# Patient Record
Sex: Male | Born: 1960
Health system: Southern US, Community
[De-identification: ages and names within clinical notes are randomized; demographics above are authoritative.]

## PROBLEM LIST (undated history)

## (undated) DIAGNOSIS — I4891 Unspecified atrial fibrillation: Secondary | ICD-10-CM

## (undated) DIAGNOSIS — IMO0002 Reserved for concepts with insufficient information to code with codable children: Secondary | ICD-10-CM

## (undated) DIAGNOSIS — K219 Gastro-esophageal reflux disease without esophagitis: Secondary | ICD-10-CM

## (undated) DIAGNOSIS — E669 Obesity, unspecified: Secondary | ICD-10-CM

## (undated) DIAGNOSIS — I219 Acute myocardial infarction, unspecified: Secondary | ICD-10-CM

## (undated) DIAGNOSIS — F4481 Dissociative identity disorder: Secondary | ICD-10-CM

## (undated) DIAGNOSIS — J449 Chronic obstructive pulmonary disease, unspecified: Secondary | ICD-10-CM

## (undated) DIAGNOSIS — Z950 Presence of cardiac pacemaker: Secondary | ICD-10-CM

## (undated) DIAGNOSIS — E781 Pure hyperglyceridemia: Secondary | ICD-10-CM

## (undated) DIAGNOSIS — R55 Syncope and collapse: Secondary | ICD-10-CM

## (undated) DIAGNOSIS — F319 Bipolar disorder, unspecified: Secondary | ICD-10-CM

## (undated) DIAGNOSIS — E119 Type 2 diabetes mellitus without complications: Secondary | ICD-10-CM

## (undated) DIAGNOSIS — I2510 Atherosclerotic heart disease of native coronary artery without angina pectoris: Secondary | ICD-10-CM

## (undated) HISTORY — DX: Presence of cardiac pacemaker: Z95.0

## (undated) HISTORY — DX: Type 2 diabetes mellitus without complications: E11.9

## (undated) HISTORY — DX: Pure hyperglyceridemia: E78.1

## (undated) HISTORY — DX: Syncope and collapse: R55

## (undated) HISTORY — DX: Obesity, unspecified: E66.9

---

## 2001-05-02 ENCOUNTER — Encounter: Payer: Self-pay | Admitting: *Deleted

## 2001-05-02 ENCOUNTER — Emergency Department (HOSPITAL_COMMUNITY): Admission: EM | Admit: 2001-05-02 | Discharge: 2001-05-02 | Payer: Self-pay | Admitting: *Deleted

## 2001-05-08 ENCOUNTER — Encounter: Payer: Self-pay | Admitting: *Deleted

## 2001-05-08 ENCOUNTER — Emergency Department (HOSPITAL_COMMUNITY): Admission: EM | Admit: 2001-05-08 | Discharge: 2001-05-08 | Payer: Self-pay | Admitting: Emergency Medicine

## 2001-05-28 ENCOUNTER — Encounter: Admission: RE | Admit: 2001-05-28 | Discharge: 2001-07-03 | Payer: Self-pay | Admitting: Family Medicine

## 2002-03-11 ENCOUNTER — Emergency Department (HOSPITAL_COMMUNITY): Admission: EM | Admit: 2002-03-11 | Discharge: 2002-03-11 | Payer: Self-pay | Admitting: Emergency Medicine

## 2002-09-22 ENCOUNTER — Encounter: Payer: Self-pay | Admitting: Emergency Medicine

## 2002-09-22 ENCOUNTER — Inpatient Hospital Stay (HOSPITAL_COMMUNITY): Admission: EM | Admit: 2002-09-22 | Discharge: 2002-09-28 | Payer: Self-pay | Admitting: Emergency Medicine

## 2002-09-23 ENCOUNTER — Encounter: Payer: Self-pay | Admitting: *Deleted

## 2002-09-23 ENCOUNTER — Encounter: Payer: Self-pay | Admitting: Internal Medicine

## 2002-09-24 ENCOUNTER — Encounter: Payer: Self-pay | Admitting: Internal Medicine

## 2002-09-26 ENCOUNTER — Encounter: Payer: Self-pay | Admitting: *Deleted

## 2002-09-26 HISTORY — PX: CARDIAC CATHETERIZATION: SHX172

## 2002-12-08 ENCOUNTER — Inpatient Hospital Stay (HOSPITAL_COMMUNITY): Admission: EM | Admit: 2002-12-08 | Discharge: 2002-12-10 | Payer: Self-pay | Admitting: Emergency Medicine

## 2002-12-08 ENCOUNTER — Encounter: Payer: Self-pay | Admitting: Emergency Medicine

## 2002-12-10 ENCOUNTER — Encounter: Payer: Self-pay | Admitting: *Deleted

## 2002-12-11 ENCOUNTER — Emergency Department (HOSPITAL_COMMUNITY): Admission: EM | Admit: 2002-12-11 | Discharge: 2002-12-11 | Payer: Self-pay | Admitting: Emergency Medicine

## 2002-12-11 ENCOUNTER — Inpatient Hospital Stay (HOSPITAL_COMMUNITY): Admission: EM | Admit: 2002-12-11 | Discharge: 2002-12-12 | Payer: Self-pay | Admitting: Cardiology

## 2003-01-15 ENCOUNTER — Inpatient Hospital Stay (HOSPITAL_COMMUNITY): Admission: EM | Admit: 2003-01-15 | Discharge: 2003-01-17 | Payer: Self-pay | Admitting: Emergency Medicine

## 2003-01-15 ENCOUNTER — Encounter: Payer: Self-pay | Admitting: Emergency Medicine

## 2003-01-16 ENCOUNTER — Encounter: Payer: Self-pay | Admitting: Emergency Medicine

## 2003-01-24 ENCOUNTER — Encounter: Payer: Self-pay | Admitting: Emergency Medicine

## 2003-01-24 ENCOUNTER — Observation Stay (HOSPITAL_COMMUNITY): Admission: EM | Admit: 2003-01-24 | Discharge: 2003-01-25 | Payer: Self-pay | Admitting: Emergency Medicine

## 2003-01-28 ENCOUNTER — Inpatient Hospital Stay (HOSPITAL_COMMUNITY): Admission: EM | Admit: 2003-01-28 | Discharge: 2003-02-04 | Payer: Self-pay | Admitting: Cardiology

## 2003-02-02 ENCOUNTER — Encounter: Payer: Self-pay | Admitting: *Deleted

## 2003-02-03 HISTORY — PX: PACEMAKER INSERTION: SHX728

## 2003-02-04 ENCOUNTER — Encounter: Payer: Self-pay | Admitting: *Deleted

## 2003-02-26 ENCOUNTER — Ambulatory Visit (HOSPITAL_COMMUNITY): Admission: RE | Admit: 2003-02-26 | Discharge: 2003-02-26 | Payer: Self-pay | Admitting: *Deleted

## 2003-02-26 ENCOUNTER — Encounter: Payer: Self-pay | Admitting: *Deleted

## 2003-04-03 ENCOUNTER — Encounter: Payer: Self-pay | Admitting: *Deleted

## 2003-04-04 ENCOUNTER — Inpatient Hospital Stay (HOSPITAL_COMMUNITY): Admission: EM | Admit: 2003-04-04 | Discharge: 2003-04-07 | Payer: Self-pay | Admitting: *Deleted

## 2003-04-07 ENCOUNTER — Encounter: Payer: Self-pay | Admitting: Neurology

## 2003-04-16 ENCOUNTER — Emergency Department (HOSPITAL_COMMUNITY): Admission: EM | Admit: 2003-04-16 | Discharge: 2003-04-16 | Payer: Self-pay | Admitting: Emergency Medicine

## 2003-04-22 ENCOUNTER — Emergency Department (HOSPITAL_COMMUNITY): Admission: EM | Admit: 2003-04-22 | Discharge: 2003-04-22 | Payer: Self-pay | Admitting: Emergency Medicine

## 2003-05-01 ENCOUNTER — Encounter: Payer: Self-pay | Admitting: *Deleted

## 2003-05-01 ENCOUNTER — Emergency Department (HOSPITAL_COMMUNITY): Admission: EM | Admit: 2003-05-01 | Discharge: 2003-05-01 | Payer: Self-pay | Admitting: *Deleted

## 2003-06-09 ENCOUNTER — Emergency Department (HOSPITAL_COMMUNITY): Admission: EM | Admit: 2003-06-09 | Discharge: 2003-06-10 | Payer: Self-pay | Admitting: Emergency Medicine

## 2003-10-08 ENCOUNTER — Inpatient Hospital Stay (HOSPITAL_COMMUNITY): Admission: EM | Admit: 2003-10-08 | Discharge: 2003-10-09 | Payer: Self-pay | Admitting: Emergency Medicine

## 2003-10-16 ENCOUNTER — Emergency Department (HOSPITAL_COMMUNITY): Admission: EM | Admit: 2003-10-16 | Discharge: 2003-10-16 | Payer: Self-pay | Admitting: Emergency Medicine

## 2003-11-02 ENCOUNTER — Inpatient Hospital Stay (HOSPITAL_COMMUNITY): Admission: AD | Admit: 2003-11-02 | Discharge: 2003-11-04 | Payer: Self-pay | Admitting: Cardiovascular Disease

## 2003-11-02 ENCOUNTER — Encounter: Payer: Self-pay | Admitting: Emergency Medicine

## 2003-11-18 ENCOUNTER — Emergency Department (HOSPITAL_COMMUNITY): Admission: EM | Admit: 2003-11-18 | Discharge: 2003-11-19 | Payer: Self-pay | Admitting: Emergency Medicine

## 2003-12-31 ENCOUNTER — Emergency Department (HOSPITAL_COMMUNITY): Admission: EM | Admit: 2003-12-31 | Discharge: 2003-12-31 | Payer: Self-pay | Admitting: Emergency Medicine

## 2006-09-26 ENCOUNTER — Ambulatory Visit (HOSPITAL_COMMUNITY): Admission: RE | Admit: 2006-09-26 | Discharge: 2006-09-26 | Payer: Self-pay | Admitting: *Deleted

## 2008-12-04 ENCOUNTER — Inpatient Hospital Stay (HOSPITAL_COMMUNITY): Admission: EM | Admit: 2008-12-04 | Discharge: 2008-12-06 | Payer: Self-pay | Admitting: Emergency Medicine

## 2008-12-05 ENCOUNTER — Encounter (INDEPENDENT_AMBULATORY_CARE_PROVIDER_SITE_OTHER): Payer: Self-pay | Admitting: Family Medicine

## 2008-12-22 ENCOUNTER — Ambulatory Visit: Payer: Self-pay | Admitting: Cardiology

## 2010-03-10 ENCOUNTER — Emergency Department (HOSPITAL_COMMUNITY): Admission: EM | Admit: 2010-03-10 | Discharge: 2010-03-10 | Payer: Self-pay | Admitting: Emergency Medicine

## 2010-08-09 ENCOUNTER — Encounter: Payer: Self-pay | Admitting: Cardiovascular Disease

## 2010-10-26 LAB — CBC
HCT: 36.9 % — ABNORMAL LOW (ref 39.0–52.0)
HCT: 38 % — ABNORMAL LOW (ref 39.0–52.0)
HCT: 42.7 % (ref 39.0–52.0)
Hemoglobin: 13.4 g/dL (ref 13.0–17.0)
Hemoglobin: 13.8 g/dL (ref 13.0–17.0)
Hemoglobin: 15.4 g/dL (ref 13.0–17.0)
MCHC: 36.1 g/dL — ABNORMAL HIGH (ref 30.0–36.0)
MCHC: 36.2 g/dL — ABNORMAL HIGH (ref 30.0–36.0)
MCHC: 36.3 g/dL — ABNORMAL HIGH (ref 30.0–36.0)
MCV: 90.3 fL (ref 78.0–100.0)
MCV: 90.5 fL (ref 78.0–100.0)
MCV: 90.6 fL (ref 78.0–100.0)
Platelets: 235 10*3/uL (ref 150–400)
Platelets: 242 10*3/uL (ref 150–400)
Platelets: 244 10*3/uL (ref 150–400)
RBC: 4.08 MIL/uL — ABNORMAL LOW (ref 4.22–5.81)
RBC: 4.21 MIL/uL — ABNORMAL LOW (ref 4.22–5.81)
RBC: 4.71 MIL/uL (ref 4.22–5.81)
RDW: 12.7 % (ref 11.5–15.5)
RDW: 12.9 % (ref 11.5–15.5)
RDW: 13 % (ref 11.5–15.5)
WBC: 10 10*3/uL (ref 4.0–10.5)
WBC: 10.3 10*3/uL (ref 4.0–10.5)
WBC: 11.7 10*3/uL — ABNORMAL HIGH (ref 4.0–10.5)

## 2010-10-26 LAB — DIFFERENTIAL
Basophils Absolute: 0.1 10*3/uL (ref 0.0–0.1)
Basophils Absolute: 0.1 10*3/uL (ref 0.0–0.1)
Basophils Absolute: 0.1 10*3/uL (ref 0.0–0.1)
Basophils Relative: 1 % (ref 0–1)
Basophils Relative: 1 % (ref 0–1)
Basophils Relative: 1 % (ref 0–1)
Eosinophils Absolute: 0.3 10*3/uL (ref 0.0–0.7)
Eosinophils Absolute: 0.3 10*3/uL (ref 0.0–0.7)
Eosinophils Absolute: 0.4 10*3/uL (ref 0.0–0.7)
Eosinophils Relative: 3 % (ref 0–5)
Eosinophils Relative: 3 % (ref 0–5)
Eosinophils Relative: 4 % (ref 0–5)
Lymphocytes Relative: 23 % (ref 12–46)
Lymphocytes Relative: 23 % (ref 12–46)
Lymphocytes Relative: 28 % (ref 12–46)
Lymphs Abs: 2.3 10*3/uL (ref 0.7–4.0)
Lymphs Abs: 2.7 10*3/uL (ref 0.7–4.0)
Lymphs Abs: 2.8 10*3/uL (ref 0.7–4.0)
Monocytes Absolute: 0.6 10*3/uL (ref 0.1–1.0)
Monocytes Absolute: 0.6 10*3/uL (ref 0.1–1.0)
Monocytes Absolute: 0.7 10*3/uL (ref 0.1–1.0)
Monocytes Relative: 6 % (ref 3–12)
Monocytes Relative: 6 % (ref 3–12)
Monocytes Relative: 6 % (ref 3–12)
Neutro Abs: 6 10*3/uL (ref 1.7–7.7)
Neutro Abs: 6.9 10*3/uL (ref 1.7–7.7)
Neutro Abs: 7.9 10*3/uL — ABNORMAL HIGH (ref 1.7–7.7)
Neutrophils Relative %: 61 % (ref 43–77)
Neutrophils Relative %: 67 % (ref 43–77)
Neutrophils Relative %: 67 % (ref 43–77)

## 2010-10-26 LAB — GLUCOSE, CAPILLARY
Glucose-Capillary: 104 mg/dL — ABNORMAL HIGH (ref 70–99)
Glucose-Capillary: 121 mg/dL — ABNORMAL HIGH (ref 70–99)
Glucose-Capillary: 140 mg/dL — ABNORMAL HIGH (ref 70–99)
Glucose-Capillary: 145 mg/dL — ABNORMAL HIGH (ref 70–99)
Glucose-Capillary: 186 mg/dL — ABNORMAL HIGH (ref 70–99)
Glucose-Capillary: 205 mg/dL — ABNORMAL HIGH (ref 70–99)
Glucose-Capillary: 207 mg/dL — ABNORMAL HIGH (ref 70–99)
Glucose-Capillary: 207 mg/dL — ABNORMAL HIGH (ref 70–99)
Glucose-Capillary: 231 mg/dL — ABNORMAL HIGH (ref 70–99)
Glucose-Capillary: 248 mg/dL — ABNORMAL HIGH (ref 70–99)
Glucose-Capillary: 279 mg/dL — ABNORMAL HIGH (ref 70–99)
Glucose-Capillary: 283 mg/dL — ABNORMAL HIGH (ref 70–99)
Glucose-Capillary: 299 mg/dL — ABNORMAL HIGH (ref 70–99)
Glucose-Capillary: 315 mg/dL — ABNORMAL HIGH (ref 70–99)

## 2010-10-26 LAB — COMPREHENSIVE METABOLIC PANEL
ALT: 35 U/L (ref 0–53)
AST: 24 U/L (ref 0–37)
Albumin: 3.3 g/dL — ABNORMAL LOW (ref 3.5–5.2)
Alkaline Phosphatase: 113 U/L (ref 39–117)
BUN: 6 mg/dL (ref 6–23)
CO2: 26 mEq/L (ref 19–32)
Calcium: 8.5 mg/dL (ref 8.4–10.5)
Chloride: 95 mEq/L — ABNORMAL LOW (ref 96–112)
Creatinine, Ser: 0.91 mg/dL (ref 0.4–1.5)
GFR calc Af Amer: >60 mL/min (ref >60)
GFR calc non Af Amer: >60 mL/min (ref >60)
Glucose, Bld: 413 mg/dL — ABNORMAL HIGH (ref 70–99)
Potassium: 3.3 mEq/L — ABNORMAL LOW (ref 3.5–5.1)
Sodium: 131 mEq/L — ABNORMAL LOW (ref 135–145)
Total Bilirubin: 0.3 mg/dL (ref 0.3–1.2)
Total Protein: 6.2 g/dL (ref 6.0–8.3)

## 2010-10-26 LAB — PROTIME-INR
INR: 0.9 (ref 0.00–1.49)
INR: 1 (ref 0.00–1.49)
Prothrombin Time: 12.5 seconds (ref 11.6–15.2)
Prothrombin Time: 13.1 seconds (ref 11.6–15.2)

## 2010-10-26 LAB — BASIC METABOLIC PANEL
BUN: 4 mg/dL — ABNORMAL LOW (ref 6–23)
BUN: 5 mg/dL — ABNORMAL LOW (ref 6–23)
CO2: 26 mEq/L (ref 19–32)
CO2: 28 mEq/L (ref 19–32)
Calcium: 7.9 mg/dL — ABNORMAL LOW (ref 8.4–10.5)
Calcium: 8.1 mg/dL — ABNORMAL LOW (ref 8.4–10.5)
Chloride: 105 mEq/L (ref 96–112)
Chloride: 105 mEq/L (ref 96–112)
Creatinine, Ser: 0.69 mg/dL (ref 0.4–1.5)
Creatinine, Ser: 0.78 mg/dL (ref 0.4–1.5)
GFR calc Af Amer: >60 mL/min (ref >60)
GFR calc Af Amer: >60 mL/min (ref >60)
GFR calc non Af Amer: >60 mL/min (ref >60)
GFR calc non Af Amer: >60 mL/min (ref >60)
Glucose, Bld: 235 mg/dL — ABNORMAL HIGH (ref 70–99)
Glucose, Bld: 247 mg/dL — ABNORMAL HIGH (ref 70–99)
Potassium: 3.4 mEq/L — ABNORMAL LOW (ref 3.5–5.1)
Potassium: 3.6 mEq/L (ref 3.5–5.1)
Sodium: 137 mEq/L (ref 135–145)
Sodium: 138 mEq/L (ref 135–145)

## 2010-10-26 LAB — CARDIAC PANEL(CRET KIN+CKTOT+MB+TROPI)
CK, MB: 1 ng/mL (ref 0.3–4.0)
Relative Index: INVALID (ref 0.0–2.5)
Total CK: 76 U/L (ref 7–232)
Troponin I: 0.01 ng/mL (ref 0.00–0.06)

## 2010-10-26 LAB — POCT CARDIAC MARKERS
CKMB, poc: <1 ng/mL — ABNORMAL LOW (ref 1.0–8.0)
CKMB, poc: <1 ng/mL — ABNORMAL LOW (ref 1.0–8.0)
Myoglobin, poc: 36.4 ng/mL (ref 12–200)
Myoglobin, poc: 39.3 ng/mL (ref 12–200)
Troponin i, poc: <0.05 ng/mL (ref 0.00–0.09)
Troponin i, poc: <0.05 ng/mL (ref 0.00–0.09)

## 2010-10-26 LAB — APTT: aPTT: 27 seconds (ref 24–37)

## 2010-10-26 LAB — LITHIUM LEVEL: Lithium Lvl: <0.25 mEq/L — ABNORMAL LOW (ref 0.80–1.40)

## 2010-10-26 LAB — MAGNESIUM: Magnesium: 2 mg/dL (ref 1.5–2.5)

## 2010-10-26 LAB — HEMOGLOBIN A1C
Hgb A1c MFr Bld: 12.2 % — ABNORMAL HIGH (ref 4.6–6.1)
Mean Plasma Glucose: 303 mg/dL

## 2010-10-26 LAB — PHOSPHORUS: Phosphorus: 3 mg/dL (ref 2.3–4.6)

## 2010-11-30 NOTE — H&P (Signed)
Frank Powell, Frank Powell NO.:  [id number]   MEDICAL RECORD NO.:  [id number]          PATIENT TYPE:  INP   LOCATION:  A315                          FACILITY:  [facility]   PHYSICIAN:  Dorris Singh, DO    DATE OF BIRTH:  1961/01/01   DATE OF ADMISSION:  12/04/2008  DATE OF DISCHARGE:  LH                              HISTORY & PHYSICAL   CHIEF COMPLAINT:  Chest discomfort.   The patient is a 50 year old male who presented to the [facility]  emergency room with a chief complaint of chest discomfort that started  yesterday and it was insidious and the pattern has been constant.  The  course has been stable and it is located on the left side with no  radiation.  He describes it being a pressure feeling and it is 9/10 at  its worse.  When he was seen in the emergency room it was 9/10 but now  it has actually been relieved.  It was aggravated by nothing and nothing  made it feel better.  It had been associated with some cough, dyspnea  and wheezing, and he does have a positive history for smoking.  He does  have a history of a pacemaker.   PAST MEDICAL HISTORY:  He does have a history of coronary artery disease  and hypertension.   PAST SURGICAL HISTORY:  He does have surgical history for pacemaker  insertion.   SOCIAL HISTORY:  Nondrinker.  No drug abuse but does smoke.   CARDIOLOGIST:  [facility].   ALLERGIES:  He has no known drug allergies.   MEDICATIONS:  He is only able to give [place] one of his medications which is  Geodon.   REVIEW OF SYSTEMS:  As mentioned above, as reviewed in the HPI.   TESTS:  He currently had a chest x-ray which showed no acute  abnormalities with a two-view chest.   LABORATORY DATA:  White count 10.3, hemoglobin 15.4, hematocrit 42.7 and  platelet count of 242.  Sodium 131, potassium 3.3, chloride 95, carbon  dioxide 26, glucose 413, BUN 6, creatinine 0.91.  His INR is 0.9.  First  set of troponins are negative.   ASSESSMENT/PLAN:  1. Chest pain.  2. Hyponatremia.  3. Hypokalemia.   Will go ahead and admit the patient to observation for chest pain for  rule out.  Will have [facility] see him on him.  Will give  him IV fluids to help replace his potassium and sodium.  Will continue  to monitor him with blood work in the morning and an EKG.  Will do GI  prophylaxis and DVT prophylaxis and will place him on his home  medications once he gives [place] his doses.  We will continue to monitor and  make any necessary changes.      Dorris Singh, DO  Electronically Signed     CB/MEDQ  D:  12/04/2008  T:  12/04/2008  Job:  [id number]

## 2010-11-30 NOTE — Group Therapy Note (Signed)
Frank Powell, Frank Powell NO.:  [id number]   MEDICAL RECORD NO.:  [id number]          PATIENT TYPE:  INP   LOCATION:  A315                          FACILITY:  [facility]   PHYSICIAN:  Dorris Singh, DO    DATE OF BIRTH:  Jun 10, 1961   DATE OF PROCEDURE:  12/05/2008  DATE OF DISCHARGE:                                 PROGRESS NOTE   The patient was seen today, having no complaints.  Chest pain has still  been present.  Cardiology is supposed to see him today.  Also his blood  sugars were brought down and he was taken off the Glucommander.   VITAL SIGNS:  Temperature 98.1, pulse 64, respirations 20, blood  pressure  98/63.  GENERALLY:  The patient is well-developed, well-nourished, in no acute  distress, has flat affect  HEART:  Regular rate and rhythm.  LUNGS:  Clear to auscultation bilaterally.  ABDOMEN:  Soft, nontender, nondistended.  EXTREMITIES:  Positive pulses.  No edema, ecchymosis or cyanosis.   White count 11.7, hemoglobin 13.8, hematocrit 38.0 and platelet count  235.  His BMET is within normal limits except for glucose of 235 and a  BUN of 5.  His lithium level is also low.   ASSESSMENT/PLAN:  1. Chest pain, awaiting cardiology recommendations.  I suspect that      the patient may need further workup, but this may be able to be      done with outpatient cardiology, so await their further      recommendations.  2. New-onset diabetes.  Will continue to monitor the patient and start      him on Lantus and have him follow up with his primary care doctor      once he is discharged, as well.  Lantus as well as metformin and      hopefully we will get him outpatient diabetic teaching.   If he continues to improve, he can be discharged within the next 24-48  hours.      Dorris Singh, DO  Electronically Signed     CB/MEDQ  D:  12/05/2008  T:  12/05/2008  Job:  [id number]

## 2010-11-30 NOTE — Consult Note (Signed)
Frank Powell, Frank Powell NO.:  [id number]   MEDICAL RECORD NO.:  [id number]          PATIENT TYPE:  INP   LOCATION:  A315                          FACILITY:  [facility]   PHYSICIAN:  Antonieta Iba, MD   DATE OF BIRTH:  08-Aug-1960   DATE OF CONSULTATION:  12/04/2008  DATE OF DISCHARGE:                                 CONSULTATION   Frank Powell is a 50 year old gentleman with chronic bipolar disorder,  continued tobacco use, who smokes one pack per day, obstructive sleep  apnea on CPAP with normal coronary arteries by catheterization in March  2004 with history of syncope with pacemaker placed for neurocardiogenic  syncope and July 2004 who presented with chest pain.   Frank Powell states the chest pain started two days  ago and was stuttering  on Wednesday and then got worse on Thursday.  He states that he was  sitting on the couch watching TV when his chest pain first presented.  Typically the pain came on at rest and was not associated with exertion.  He denied any radiation to his shoulders, jaw or neck.  He had no  sweating or diaphoresis.  No GI upset.  He has not had chest pain like  this before.  He in general leads a  very sedentary lifestyle and stays  at his sister's house.  Otherwise he has no complaints and states his  appetite is good and he has  been ambulating without any significant  difficulty.   PAST MEDICAL HISTORY:  Notable for the clean coronaries in March 2004,  history of syncope with occasional lightheaded spells.  Diagnosis of  recurrent  vasopressor syncope/ neurocardiogenic syncope with pacemaker  placed in July 2004, obstructive sleep apnea on CPAP,  long history of  smoking, and bipolar disorder.   MEDICATIONS:  Include Neurontin  300 mg t.i.d., Dilantin 100 mg, 2  tablets in the a.m. and 2 tablets q.h.s., Geodon 80 mg b.i.d., albuterol  inhaler, Advair 2 puffs b.i.d., atenolol 25 mg daily, Seroquel 300 mg  q.h.s., pantoprazole 40 mg b.i.d.  changed to omeprazole 20 mg b.i.d. due  to insurance issues.   SOCIAL HISTORY:  In terms of his social history, he continues to smoke,  no significant alcohol.  He is a divorced father of two with one  grandchild and currently disabled with long-term disability.   FAMILY HISTORY:  Noncontributory.   PHYSICAL EXAMINATION:  His blood pressure is 125/78 with a heart rate of  70-90; he is afebrile with respirations of 20 and saturations of 96% on  room air.  He is a disheveled gentleman with a distinct odor.  HEENT:  Generally benign.  Oropharynx clear.  NECK:  Supple with no JVP or carotid bruit.  HEART:  Heart sounds are regular, S1-S2 with no murmurs appreciated.  LUNGS:  Clear to auscultation.  No wheezes, rales.  ABDOMEN:  Benign apart from some obesity.  He has no significant lower  extremity edema.  NEUROLOGIC:  Grossly nonfocal.  SKIN:  Warm and dry.   EKG shows no significant ST-T wave changes and  no significant changes  when compared to previous EKG from the clinic.   Chest x-ray shows no acute abnormalities.   Labs:  Hemoglobin A1c of12, point of care markers are negative x2,  glucose 235, creatinine 0.78, potassium 3.6 and sodium of  138.  White  count of 11.7, hematocrit 38 and platelets of 235.   In summary, Frank Powell is a 50 year old gentleman with chronic bipolar  disorder, obstructive sleep apnea, obesity, and negative cardiac  catheterization in 2004 who presented with some negative atypical chest  pain.  To date his enzymes are negative.  We would suggest repeating one  more set of enzymes for rule out.  He is currently pain free with no  significant EKG changes and his symptoms are likely atypical.  He does  have risk factors in that he is male, his diabetes is poorly controlled  and we could do an outpatient stress test either through  [facility] or through the clinic.  We will likely schedule it at [facility] and this could be done  potentially next week with  [facility] helping with the stress portion of the test.  We will  be happy to follow up on the results of the scan.  I would be happy to  help try to arrange this for next week if he is discharged from the  hospital either today  (Friday) or over the weekend, we would contact  him to arrange a  time was suitable for him for the stress test.      Antonieta Iba, MD  Electronically Signed     TJG/MEDQ  D:  12/05/2008  T:  12/05/2008  Job:  [id number]
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
## 2010-12-03 NOTE — Discharge Summary (Signed)
NAME:  Frank Powell, Frank Powell                          ACCOUNT NO.:  [id number]   MEDICAL RECORD NO.:  [id number]                   PATIENT TYPE:  OUT   LOCATION:  SLEE                                 FACILITY:   PHYSICIAN:  Hanley Hays. [place], M.D.           DATE OF BIRTH:  12-07-1960   DATE OF ADMISSION:  04/04/2003  DATE OF DISCHARGE:  04/07/2003                                 DISCHARGE SUMMARY   DISCHARGE DIAGNOSES:  1. Recurrent syncope with documented vasodepressor activity.  2. Status post pacemaker placement for same.  Pacemaker interrogated and     functioning appropriately.  3. Depression with anxiety disorder followed by Dr. Rudi Heap at [facility].  4. Suicidal ideation with follow up arranged for today at mental health,     though the patient denies any plan and agrees to not hurt himself. He     will be in the company of his brother.   PROCEDURES:  1. EEG  2. Carotid Dopplers  3. CT of the head all normal.  4. MRI could not be done secondary to pacemaker.   HOSPITAL COURSE:  The patient is a 50 year old gentleman with recurrent  syncope who actually underwent workup by ET lab as well as tilt table  testing all unremarkable.  He had several witnessed episodes in the presence  of the cardiologist where he would literally fall out. He was unresponsive  for up to a minute or so and then would come around. He complained of being  fatigued and not sharp for several hours after the episodes.  Neurology  consultation was obtained given the fact that these episodes were not  clearly related to his bradycardia as when he did have an episode and the  pacemaker was placed there was no improvement.  The patient also found out  that he did not qualify for disability and became quite anxious and upset.  He had several episodes of falling out during the hospital stay though his  heart rate was not clearly documented as shutting down, the pacemaker fired  appropriately.  He returned to his baseline mental status.  He noted that he  was going to kill himself with the telephone cord and we felt that he  needed to be at [facility]. He said that he heard voices  telling him to kill himself.  He was placed on suicide precautions.  The  following day the patient stated that he had no intention of hurting himself  he was just upset receiving his disability denial.  Arrangements for  follow up were made as noted.  It was felt that the patient certainly had  some etiology of the syncopal episodes which we were not able to truly  elucidate and it was suggested that he be referred to a tertiary center  where this could be explored more fully. Clearly the patient is disabled  from the standpoint that he cannot operate any kind of mechanical devices or  drive because of the risk of his syncopal episodes.  At best the patient  would benefit from vocational rehab and certainly would qualify for some  form of disability or assistance.   He apparently also has some learning defects which were never clearly  elucidated either, but this will be deferred to psychiatry and his primary  care physician.  It was felt that he would benefit from being discharged  from the hospital and this was accomplished.  He was discharged to home with  his usual medications which he had been receiving the whole time in the  hospital.   DISCHARGE MEDICATIONS:  1. Diazepam 10 q.i.d.  2. Lexapro 10 daily  3. Zantac150 b.i.d.  4. Metadrine 10 t.i.d.   Apparently he had been on Risperdal in the past, but I believe that it was  stopped because there was concern that it may be playing a role in his  decreased blood pressure, but obviously this was not the inciting factor.  The patient was seen in consultation by cardiology who felt that they have  no further  workup that was necessary at this time.   PHYSICAL EXAMINATION:  GENERAL:  Physical exam at the time of  discharge is  unremarkable.  VITAL SIGNS:  Blood pressure was 126/75, pulse is 58 and regular.  Respirations are unlabored.  NECK:  Supple.  No JVD, adenopathy or thyromegaly.  ABDOMEN:  Abdomen was slightly obese, soft and nontender.  EXTREMITIES:  Without clubbing, cyanosis, or edema.  CHEST:  The pacemaker is nontender and unremarkable.  NEUROLOGIC: Alert and appropriate, nonfocal exam with normal gait.     ___________________________________________                                         [name], M.D.   FED/MEDQ  D:  04/07/2003  T:  04/07/2003  Job:  [id number]   cc:   Annette Stable Hasanaj  [address]  [place]  [place] [address]  Fax: [phone]   Kofi A. Gerilyn Pilgrim, M.D.  [address] [place]  [place]  [place] [address]  Fax: [phone]   [facility]
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
## 2010-12-03 NOTE — H&P (Signed)
NAME:  Frank Powell, Frank Powell                          ACCOUNT NO.:  [id number]   MEDICAL RECORD NO.:  [id number]                   PATIENT TYPE:  INP   LOCATION:  A207                                 FACILITY:  [facility]   PHYSICIAN:  Hanley Hays. Dechurch, M.D.           DATE OF BIRTH:  05/27/1961   DATE OF ADMISSION:  10/08/2003  DATE OF DISCHARGE:                                HISTORY & PHYSICAL   HISTORY OF PRESENT ILLNESS:  This is a 50 year old Caucasian male followed  by [facility] and Dr. Micah Flesher in [place] with multiple issues  with a history of recurrent syncope with documented vasodepressor activity,  and status post pacemaker placement for same, who has had multiple hospital  stays for same.  Apparently today the patient had another episode and was  brought to the emergency room by EMS.  However, today's episode was  different from the standpoint that the patient has had a different kind of  chest pain than normal.  He noted substernal pressure and pain that  awakened him early this morning.  He had some associated shortness of  breath.  He also had radiation to both arms with tingling in his hands.  He  stated he finally was able to get back to sleep after about two hours of the  pain as it eased off, but it never quite resolved.  This morning, after his  syncopal episode, after he was en route by EMS, he was given nitroglycerin.  By the third nitroglycerin, he noted marked improvement in his pain.   PAST MEDICAL HISTORY:  The patient's history is remarkable for a cardiac  catheterization in March of 2004 with normal coronary arteries.  He has had  two pacemaker placements since then.  He has had multiple other evaluations  including EP study and tilt-table testing.  He is on multiple medications  and apparently had a suicide attempt about three or four weeks ago, and was  hospitalized at [facility].  No records are available.  Apparently  some time in the last  couple of months, he has also had a respiratory  arrest.  It was unclear whether there was medications involved, and/or  aspiration, but he was actually hospitalized at [facility] with  mechanical ventilation, though I do not have the records as of yet.  Apparently his doctor told him he had brain damage, and this was probably  playing a role in his neurogenic syncope.  In any event, the patient is  admitted to the hospital for evaluation of his chest pain that resolved with  nitroglycerin.   CURRENT MEDICATIONS:  1. Midodrine.  2. Florinef 0.1.  3. Lexapro.  4. Diazepam.  He is being tapered down on, and he believes it is at 5 q.i.d.  5. Zantac 150 b.i.d.  6. Neurontin, dose unknown.   ALLERGIES:  None.   PAST MEDICAL HISTORY:  1. Multiple syncopal episodes.  2. Peptic ulcer disease.  3. Anxiety with depression.   SOCIAL HISTORY:  The patient is living with his mother.  He is divorced.  He  has one son.  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   FAMILY MEDICAL HISTORY:  Mother with diabetes.  Father had diabetes and  heart disease.   REVIEW OF SYSTEMS:  The patient feels tired after one of these episodes.  He has chest pain always associated with these episodes, although he notes  this pain was different.  He denies any exertional symptoms.  He really is  not very active.  He has had some weight gain.  He states he is not  depressed now.  He is followed by Dr. ____ who felt he was getting too much  Valium, and they are now attempting to wean.  He is on multiple medications  to maintain his blood pressure, and continuing to make adjustments in his  dosage.  No GI or GU complaints, though he notes he is nauseated most of the  time.  He has syncopal episodes about every two weeks.  No seizure activity  has ever been documented.  He continues to drive despite cautions in the  past.  He states he has had three cardiac  arrests in the past.  He has been  trying to get disability for some time with no success, and he currently has  an attorney assisting his case.   PHYSICAL EXAMINATION:  GENERAL:  A somewhat overweight gentleman who is  alert and appropriate, in no distress.  VITAL SIGNS:  Blood pressure 120/60, pulse 64 and regular.  Respirations are  unlabored.  O2 is in place.  Saturations are 100%.  NECK:  Supple.  There is no JVD or adenopathy, thyromegaly or bruits.  LUNGS:  Clear to auscultation although diminished.  HEART:  Regular rate and rhythm without murmur, gallop or rub.  There is a  pacemaker in place.  It is nontender.  EXTREMITIES:  Without clubbing or cyanosis.  No edema.  NEUROLOGIC:  Exam is intact.  Gait is not tested.  ABDOMEN:  Obese, soft, nontender with active bowel sounds.  SKIN:  No skin rash or lesion is noted.   ASSESSMENT/PLAN:  1. Chest pain which resolved with nitroglycerin, somewhat atypical by     description, in a patient with no evidence of coronary artery disease,     one year prior, by catheterization.  I suspect this is more anxiety     related, but I can not prove it.  We will have his cardiologist see him     and monitor.  2. Recurrent syncopal episodes of unclear etiology, clearly not all     bradycardic in nature, as these have not been prevented by a pacemaker     being placed.  In any event, the patient can not drive, and this has been     discussed with him and his brother at length regarding the issues     surrounding this.  3. Depression with anxiety, with multiple suicide attempts and ideation.  He     continues to follow with Dr. ____ who is making changes in his     medications. He is currently not suicidal, though he clearly is     depressed, although he clearly is depressed.  4. Gastroesophageal reflux.  We will utilize Protonix, one here in the     hospital, and continue his  other medications once we have appropriate      dosages.    ___________________________________________                                         Hanley Hays Josefine Class, M.D.   FED/MEDQ  D:  10/08/2003  T:  10/09/2003  Job:  [id number]

## 2010-12-03 NOTE — Consult Note (Signed)
NAME:  Frank Powell, Frank Powell                          ACCOUNT NO.:  [id number]   MEDICAL RECORD NO.:  [id number]                   PATIENT TYPE:  INP   LOCATION:  A201                                 FACILITY:  [facility]   PHYSICIAN:  Kofi A. Gerilyn Pilgrim, M.D.              DATE OF BIRTH:  [birth year]   DATE OF CONSULTATION:  DATE OF DISCHARGE:                                   CONSULTATION   CONSULTING PHYSICIAN:  Kofi A. Gerilyn Pilgrim, M.D.   IMPRESSION:  Syncopal episode with no clear neurologic causes.  There does  not appear to be and clear epileptic semiology or ischemic semiology.   RECOMMENDATIONS:  The patient had an EEG, which was unremarkable.  I would  suggest that a carotid Doppler and brain MRA be carried out to complete the  neurological workup.   HISTORY:  This is a 50 year old Caucasian man who has had multiple syncopal  episodes in the past, most recently in July of this year.  They were  associated with bradykinesia.  A pacemaker was subsequently placed in July  of this year.  He had two episodes of syncope today; one happened at church.  The semiology is that he developed the acute onset of nausea, chest  discomfort, diaphoresis and then he passes out briefly.  He does report  developing dizziness just before he passed out.  The dizziness is described  as lightheadedness.  He denies any spinning sensation.  There is no  diplopia, focal weakness, dysarthria or dysphagia.  He does report having  some possible left-sided numbness involving the left upper extremity.  The  syncopal episode seem not to be related to position.  He denies any clonic  or tonic type activity.  He does report having some fine high-frequency, low-  amplitude tremors in the hands associated with and without the events.  No  oral trauma is reported.  No urinary incontinence is reported.   PAST MEDICAL HISTORY:  1. Peptic ulcer disease and diet disorder.  2. Depression.  3. Multiple syncopal episodes, status  post pacemaker placement in July 2004.   MEDICATIONS:  Admission medications:  1. Midodrine 10 mg t.i.d.  2. Ranitidine 150 mg b.i.d.  3. Lexapro 10 mg daily.  4. Diazepam 10 mg q.i.d.   ALLERGIES:  None.   SOCIAL HISTORY:  No reports of alcohol, tobacco or illicit drug use.  He is  separated from his wife and apparently is under a great deal of stress.  He  is followed by a psychiatrist at a mental health clinic.  He worked until  March of this year.  He is currently seeking disability.   FAMILY HISTORY:  Family history significant for diabetes and heart disease.   REVIEW OF SYSTEMS:  Review of systems as stated above.  He apparently has  chronic diarrhea.  Apparently there may be a history of colitis.  No  difficulty voiding.  No blood in the stool.  No blood in the urine. The  patient does report feeling sleepy after his episodes.   PHYSICAL EXAMINATION:  VITAL SIGNS: Blood pressure 170/75, pulse 64 and  respirations 16.  NECK: Neck is supple.  LUNGS: Lungs are clear to auscultation bilaterally.  CARDIOVASCULAR:  Exam reveals bradykinesia.  Normal S1 and S2.  ABDOMEN:  Abdomen soft.  EXTREMITIES:  No edema.  NEUROLOGIC: The patient is awake and alert.  He converses fluently and  coherently with no dysarthria or language impairment.  Cranial nerves II-XII  are intact including the visual fields.  Motor examination shows normal  tone, bulk and strength.  There is no pronator drift; however, the patient  does appear to have mild weakness in hand grip bilaterally, but once again  there is no pronator drift and proximal muscle strength is normal.  Coordination is intact.  Reflexes are +2 and symmetric.  Plantar reflexes  are downgoing.  Sensory exam is normal to pinprick, temperature and light  touch.  Gait is not testable.  He stood up and he felt sort of weak and  cannot take a step.  Examination of his extremities show hammertoe  bilaterally.   Thank you for this  consultation.  Please see this report for assessment and  plan.                                                 Kofi A. Gerilyn Pilgrim, M.D.    KAD/MEDQ  D:  04/04/2003  T:  04/05/2003  Job:  [id number]
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
## 2010-12-03 NOTE — Consult Note (Signed)
NAME:  Frank Powell, Frank Powell NO.:  [id number]   MEDICAL RECORD NO.:  [id number]                   PATIENT TYPE:  OBV   LOCATION:  6527                                 FACILITY:  [facility]   PHYSICIAN:  Melvyn Novas, M.D.               DATE OF BIRTH:  06/11/1961   DATE OF CONSULTATION:  01/24/2003  DATE OF DISCHARGE:  01/25/2003                                   CONSULTATION   REASON FOR CONSULTATION:  Syncope versus seizure.   REQUESTING PHYSICIAN:  Dani Gobble, M.D.   HISTORY OF PRESENT ILLNESS:  This is one of multiple admissions for this 50-  year-old Caucasian, right-handed gentleman currently separated from his  second wife who states that he is feeling spells coming on in the form of a  sweatiness and racing pulse.  Then he is trying to squat down so that he can  pass out without hurting himself.  The passing out spells have apparently  occurred more than six times total.  One time even while he was on an event  monitor.  The patient has a history of bradycardia.  It was described as  asymptomatic in his previous evaluations.  He has also history of anxiety,  depression, mental retardation, developmental delay, learning disability,  gastroesophageal reflux disease, questionable syncope, asymptomatic  bradycardia and was placed on Florinef.  Later the Florinef was replaced by  ProAmatine since May 26.  In review, it seems that the patient had no  abnormal rhythm or rate during this 30-day monitoring period.  He acts as  though he is describing a faint of 3-10 minutes in duration and not  associated with any tonic clonic activity, tongue bite, loss of  gastrointestinal function or bladder control and has also never been  followed by postictal stage.  He says he is afterwards fatigued for up to  three days.  Carotid Dopplers were negative.  Imaging studies of the brain  were negative.  Cardiac catheterization on March 2004 was normal.  Laboratory  results did not indicate any cardiac abnormality except for  hypokalemia of 3 today.   SOCIAL HISTORY:  The patient is divorced x1 and is currently separated from  wife #2.  He has two children from his first marriage, both with learning  disabilities at age 59 and 80.  He is a dropout of high school in the ninth  grade.  He also dropped out of [employer].  His mother is developmentally  delayed, mentally slow and has learning disabilities.  The patient stated he  quit drinking nine years ago as well as smoking heavily at the time and  since then has been sober.   PHYSICAL EXAMINATION:  VITAL SIGNS:  Pulse 56, heart rate, sinus  bradycardia.  Normal Q-S complexes.  Respiratory rate 14-16, blood pressure  100/50.  HEENT:  Normocephalic, atraumatic.  No thrombi, no bruits.  LUNGS:  Clear to auscultation.  HEART:  Regular rate and rhythm, no murmur.  ABDOMEN:  Soft, nontender.  EXTREMITIES:  No edema.  Dr. Domingo Sep has also no fractures or injuries were  ever occurring after the event.  No abrasions or bruises. A drug screen has  not been performed and will be recommended.  NEUROLOGIC:  Mental status:  Alert, depressed, fluent speech.  The patient  is not dysarthric, not aphasic, not apraxic.  Cranial nerve exam: Pupils are  equal to accommodation.  Extraocular motions are full and corneal gaze and  visual field intact.  Facial symmetry preserved.  Tongue and uvula moving  midline.  Neck is supple.  The patient's split sensory exactly in the  midline for forehead, lower facial branches as well as the tongue.  He  denies feeling touch on the left side of his tongue, left side of his face.  Ears:  Vibration on the left side of skull even when a tuning fork is placed  midline.  He cannot hear the vibration in his left ear.  ________  is  further deferred.  Motor exam shows full strength and tone of his upper  extremities as well as lower extremities and the patient shows no Babinski.  Normal  deep tendon reflexes.  No tremor or rigor.  _________ , the tongue  midline.  Coordination is fully intact.  When asked to cooperate, the  patient can performed multiple step commands and shows no sign of extension.   ASSESSMENT:  1. Syncope most likely of psychogenic origin.  The bradycardia was not     related to syncope spells but the patient was on a cardiac monitor.     These spells are ill defined and breath holding and have a variable     length of 3-10 minutes, hardly survivable if the patient would not     breathe.  2. No postictal except for fatigue after spells.  Functional sensory     deficit.  There is no anatomic correlation that exists in the midline,     especially not for the tongue.  3. Learning disability and psychiatric disorder.  Lower IQ range.  The     patient states he lives with his parents and that he would move out after     his disability will be granted.  4. Suicide attempt three months ago.   PLAN:  Psychiatric consult please.                                               Melvyn Novas, M.D.    CD/MEDQ  D:  01/24/2003  T:  01/25/2003  Job:  [id number]

## 2010-12-03 NOTE — Discharge Summary (Signed)
NAMECHAYDEN, GARRELTS NO.:  [id number]   MEDICAL RECORD NO.:  [id number]                  PATIENT TYPE:   LOCATION:                                       FACILITY:   PHYSICIAN:  Gracelyn Nurse, M.D.              DATE OF BIRTH:   DATE OF ADMISSION:  12/08/2002  DATE OF DISCHARGE:  12/10/2002                                 DISCHARGE SUMMARY   DISCHARGE DIAGNOSES:  1. Syncope.  2. Depression.  3. Gastroesophageal reflux disease.  4. Chronic back pain.   DISCHARGE MEDICATIONS:  1. Aspirin 81 mg daily.  2. Ranitidine 150 mg b.i.d.  3. Lexapro 10 mg daily.  4. Diazepam p.r.n.  5. Darvocet-N 100 p.r.n.   REASON FOR ADMISSION:  This is a 50 year old white male who has a history of  syncope.  He was worked up on the last admission including tilt table  testing, cardiac catheterization.  He was considered for a pacemaker  placement however, this was to be determined at a later date, in fact, he  has a followup appointment with Dr. Graciela Husbands on May 28.  He reported he felt  lightheaded, dizzy and felt like he was going to pass out.  He called his  uncle and he came to his house.  He found him unconscious.  It was unclear  how long he was out.   HOSPITAL COURSE:  Problem 1.  Syncope.  The patient was admitted, placed on  telemetry.  Cardiac enzymes were obtained.  He did rule out.  He was  bradycardic on telemetry, heart rate anywhere from the high 40s to the low  60s.  He was asymptomatic however, the night before discharge he said he had  a spell while in the bathroom where he felt lightheaded however, on the  monitor there were no abnormalities.  He did not actually pass out.  He had  no chest pain and no shortness of breath.  He was seen by Dr. Domingo Sep in  consult who did not make any medication changes.  He was ambulated in the  hall, on the monitor had no symptoms, had no changes on the monitor.  He is  being discharged in stable condition and  he is to followup with Dr. Graciela Husbands on  the 28th for reassessment regarding possible pacemaker placement.   Problem 2.  Depression.  He was maintained on his current medications.   Problem 3.  Gastroesophageal reflux disease.  He was also maintained on the  Ranitidine and had no complaints.   DISPOSITION:  The patient is discharged in stable condition.  He is to  followup with Dr. Graciela Husbands on May 28.  Gracelyn Nurse, M.D.    JDJ/MEDQ  D:  12/10/2002  T:  12/10/2002  Job:  [id number]   cc:   Kem Boroughs, M.D.  [phone] [address]  [place]  [place] [address]  Fax: [phone]   Duke Salvia, M.D.

## 2010-12-03 NOTE — Cardiovascular Report (Signed)
   NAME:  Frank Powell, Frank Powell NO.:  [id number]   MEDICAL RECORD NO.:  [id number]                   PATIENT TYPE:  INP   LOCATION:  2011                                 FACILITY:  [facility]   PHYSICIAN:  Darlin Priestly, M.D.             DATE OF BIRTH:  05/06/1961   DATE OF PROCEDURE:  09/27/2002  DATE OF DISCHARGE:  09/28/2002                              CARDIAC CATHETERIZATION   PROCEDURE PERFORMED:  Heads up tilt table testing with Isuprel infusion.   CARDIOLOGIST:  Darlin Priestly, M.D.   COMPLICATIONS:  None.   INDICATIONS FOR PROCEDURE:  This patient is a 50 year old male patient of  Dr. Kem Boroughs admitted with recurrent syncope and chest pain.  The  patient underwent cardiac catheterization yesterday by Dr. Alanda Amass with no  significant CAD with normal EF.  The patient did have a marked vagal episode  with sheath pulling.  The patient was also noted to have intermittent  episodes of junctional brachycardia while at [facility].  He is now  referred for tilt table testing to rule out neurocardiogenic syncope.   DESCRIPTION OF PROCEDURE:  After obtaining informed consent the patient was  brought to the cardiac cath lab in the fasting state.  The patient was then  placed in a supine position and blood pressure was monitored for  approximately 10 minutes.  Resting blood pressure 131/73 and resting heart  rate 51.  The patient was then tilted to a 70-degree heads up position for  approximately 30 minutes.  He remained asymptomatic.  Blood pressure and  heart rate remained stable.  The patient was then returned to a supine  position and Isuprel infusion was begun at 7.5 mL an hour.  This was  ultimately titrated to 15 mL an hour.  The patient was then again tilted in  the 70-degree position for approximately 10 minutes.  He did complain of  mild nausea, but no syncope or presyncopal symptoms. He remained  hemodynamically stable.  He was  returned again to the supine position and  the Isuprel was discontinued.  He had no symptoms correlating with his  previous syncopal episodes.   CONCLUSION:  Negative heads up tilt table testing with Isuprel infusion.                                                 Darlin Priestly, M.D.    RHM/MEDQ  D:  09/27/2002  T:  09/28/2002  Job:  [id number]   cc:   Kem Boroughs, M.D.  [phone] [address]  [place]  [place] [address]  Fax: [phone]   Richard A. Alanda Amass, M.D.  [phone] [address], [address]  [place]  [place] [address]  Fax: [phone]

## 2010-12-03 NOTE — Discharge Summary (Signed)
NAME:  Frank Powell, Frank Powell                          ACCOUNT NO.:  [id number]   MEDICAL RECORD NO.:  [id number]                   PATIENT TYPE:  INP   LOCATION:  3741                                 FACILITY:  [facility]   PHYSICIAN:  Richard A. Alanda Amass, M.D.          DATE OF BIRTH:  12/03/1960   DATE OF ADMISSION:  11/02/2003  DATE OF DISCHARGE:  11/04/2003                                 DISCHARGE SUMMARY   DISCHARGE DIAGNOSES:  1. Recurrent syncope, undetermined etiology.     a. Questionable hysterical conversion.  2. Depression, anxiety, and recent suicide attempt six weeks ago with     hospitalization.  3. Gastroesophageal reflux disease.  4. Peptic ulcer disease.   DISCHARGE MEDICATIONS:  1. Stop Zantac.  2. Protonix 40 mg one twice a day.  3. Neurontin 300 mg one three times a day.  4. Valium 5 mg three times a day.  5. Lexapro 20 mg daily.  6. ProAmatine 10 mg three times a day.  7. Florinef 0.1 mg daily.   DISCHARGE INSTRUCTIONS:  1. No work, no driving, no strenuous activity.  2. Regular diet.  3. Follow up with Dr. Domingo Sep next week, November 11, 2003, at 10 a.m.   ALLERGIES:  No known drug allergies.   CONDITION ON DISCHARGE:  Stable.   HISTORY OF PRESENT ILLNESS:  A 50 year old white male known to [facility], followed by Dr. Domingo Sep, also Dr. Josefine Class and Dr.  Michell Heinrich.  Has a history of recurrent syncope.  Cardiac catheterization in  March 2004 with normal coronaries, normal LV function.  He was having  bradycardia, sometimes associated with syncope.  A pacemaker was implanted  in July 2004.  He has had a tilt table in the past by Doylene Canning. Ladona Ridgel, M.D.  Despite all of these, he continues to have syncopal spells of undetermined  etiology.   In March 2005, he was admitted to [facility] with another episode, negative  CT scan of his head.  In September 2004, he had negative EEG, carotid  Dopplers were negative, and head CT was negative.   On  November 01, 2003, he had an episode of chest pain followed by nausea, then  syncope.  He had some shortness of breath associated, and he states he has  had some hemoptysis after episodes.  He denied any fever or chills.  He was  brought in to [facility] and admitted for further observation.   PAST MEDICAL HISTORY:  See discharge diagnoses.   SOCIAL HISTORY:  Separated.  Lives with his mother.  Has one son.  Nonsmoker.   For family history and review of systems, see H&P.   PHYSICAL EXAMINATION AT DISCHARGE:  VITAL SIGNS:  Blood pressure 115/69,  pulse 61, respirations 20, temperature 97.8, oxygen saturation on room air  94%.  GENERAL:  Alert and oriented white male.  CARDIAC:  S1, S2, regular rate and rhythm.  CHEST:  Lungs clear.  ABDOMEN:  Soft, nontender.  EXTREMITIES:  Without edema.   LABORATORY DATA:  Hemoglobin 13.5, hematocrit 39.7, WBC 9.2, platelets 316,  and his differential on admission was within normal limits.  Coagulation  studies:  Protime 12.9, INR of 1, PTT 41.  Chemistry:  Sodium 133, prior to  discharge, 140, potassium 3.8, chloride 100, CO2 29, glucose slightly  elevated at 119 and 132, BUN 11, creatinine 0.8.  Total bilirubin 0.4,  direct bilirubin 0.1, indirect bilirubin 0.3, alkaline phosphatase 68, SGOT  20, SGPT 24, total protein 5.8, albumin 3.4, and calcium 8.3.  Cardiac  enzymes:  CK 141, 109, and 99, MBs all negative at 1.4 to 1.2, and troponin  I less than 0.01.   Chest x-ray compared to March 2005:  Lungs appear better aerated, mild  cardiomegaly, permanent pacemaker, improved aeration.  CT of the chest was  done, which was negative.  I do not have the dictated report at this point.  EKG at [facility]:  Sinus rhythm, first degree AV block, and no acute  changes.   HOSPITAL COURSE:  Mr. Loberg was brought in by Dr. Alanda Amass after presenting  to [facility] with recurrent syncope and chest pain, negative  myocardial infarction, negative CT  of the chest.  The patient continued to  be stable.   Please note, during the patient's CT of his chest, while lying flat he had  an episode where it looked to the nurse as if he was holding his breath and  when his arms were brought up, he let them slowly drift downward.   Dr. Alanda Amass saw the patient the day of discharge, talked to the patient at  length.  He wants him to follow up with Dr. Josefine Class, Dr. Domingo Sep, and Dr.  Michell Heinrich at mental health.  The patient's major concern at discharge is  disability, and Dr. Alanda Amass explained to him Dr. Domingo Sep would fill that  out for him.  Dr. Alanda Amass feels the syncope is compatible with conversion  hysteria.  Will have social services see him prior to discharge.      Darcella Gasman. [place]gold, N.P.                     Richard A. Alanda Amass, M.D.    LRI/MEDQ  D:  11/04/2003  T:  11/04/2003  Job:  [id number]   cc:   Dani Gobble, MD  Fax: [phone]   Hanley Hays. Dechurch, M.D.  [address]  [place]  [place] [address]  Fax: [phone]

## 2010-12-03 NOTE — Discharge Summary (Signed)
NAME:  Frank Powell, Frank Powell                          ACCOUNT NO.:  [id number]   MEDICAL RECORD NO.:  [id number]                   PATIENT TYPE:  INP   LOCATION:  A207                                 FACILITY:  [facility]   PHYSICIAN:  Hanley Hays. Dechurch, M.D.           DATE OF BIRTH:  September 29, 1960   DATE OF ADMISSION:  10/08/2003  DATE OF DISCHARGE:  10/09/2003                                 DISCHARGE SUMMARY   DIAGNOSES:  1. Chest pain, noncardiac.  2. Recurrent syncope with documented vasodepressor activity. Status post     pacemaker placement for same.  3. Depression with anxiety disorder, status post multiple suicide attempts,     currently stable.  4. History of peptic ulcer disease and gastroesophageal reflux.  5. History of respiratory arrest with question of aspiration, though no     hospital records yet available at [facility].   DISPOSITION:  The patient is discharge to home with followup by his primary  care physician Dr. __________.   HOSPITAL COURSE:  A 50 year old white male who has undergone multiple  evaluations for recurrent syncope.  He apparently had documented bradycardia  and had a pacemaker placed.  He notes these episodes occur perhaps every two  weeks.  They are unannounced.  There is no aura.  He again has had multiple  evaluations including neurologic as well as cardiovascular without any  impact on these episodes.  He is disabled from the standpoint that he can  not perform his usual job.  He can not operate machinery, and he was  cautioned regarding driving, that he should refrain from doing so, as he  poses a great risk to himself as well as others.  The patient was in his  usual state of health until yesterday.  He noted that he had some chest pain  in the early morning hours that awakened him from sleep.  It was somewhat  atypical in nature.  He presented to the emergency room after a syncopal  episode and chest pain that was reportedly relieved with  nitroglycerin,  though upon my evaluation, his pain had not changed to any significant  degree.  His enzymes remained normal.  He was seen in consultation by  cardiology who felt that there was no need for further evaluation at this  time.  It should be noted he had a normal LV catheterization in March of  2004, with clean coronary arteries.  Given the issues and the patient's  stability, he was discharged to home in stable condition, to follow up with  his usual physician.  It may be worthwhile to explore consultation at an  academic center for evaluation of his neurogenic syncope, though it might be  a consideration to refer to an academic center for evaluation of this  somewhat unusual syncope, in order to reassure the patient and his family  that all that can be done is being done.  It is clear that the patient is disabled from any employment that would  involve physical exertion, machinery, etc., given these episodes which are  unpredictable.  He currently is in the midst of apply for disability, and  for some reason has been denied.  He has been quite distraught over these  issues to the point that he actually attempted suicide several weeks before  and was hospitalized at [facility].   DISCHARGE MEDICATIONS:  1. Neurontin, dose unknown, two tablets t.i.d.  2. Valium 5 mg t.i.d.  3. Lexapro 20 mg daily.  4. Midodrine 10 mg t.i.d. (been out for three or four days).  5. Florinef 0.025, once daily.  No changes were made to his medical regimen.   DISCHARGE CONDITION:  At the time of discharge, he is an alert, oriented  white male, who is in no distress, lying in bed, sinus rhythm, rate in the  60's.  Blood pressure 128/60.  He had no orthostatic change, it should be  noted.  His lungs are clear to auscultation though diminished.  His heart is  regular rate and rhythm, no murmur, gallop or rub. Abdomen is obese, soft,  nontender.  Extremities are without cyanosis,  clubbing or edema.  He has  some prominence in the right groin, and an ultrasound revealed no evidence  of pseudoaneurysm, or other significant finding.   PLAN:  As noted above.     ___________________________________________                                         [name], M.D.   FED/MEDQ  D:  10/09/2003  T:  10/09/2003  Job:  [id number]   cc:   Husani, Dr.  Jonita Albee

## 2010-12-03 NOTE — Discharge Summary (Signed)
NAME:  Frank Powell, Frank Powell NO.:  [id number]   MEDICAL RECORD NO.:  [id number]                   PATIENT TYPE:  INP   LOCATION:  4733                                 FACILITY:  [facility]   PHYSICIAN:  Darlin Priestly, M.D.             DATE OF BIRTH:  [birth year]   DATE OF ADMISSION:  01/28/2003  DATE OF DISCHARGE:  02/04/2003                                 DISCHARGE SUMMARY   DISCHARGE DIAGNOSES:  1. Status post permanent pacemaker secondary to recurrent syncopal episodes.  2. Anxiety with depression.  3. Gastroesophageal reflux disease.  4. Possible metabolic syndrome.   HISTORY OF PRESENT ILLNESS:  The patient, a 50 year old Caucasian gentleman,  has had multiple admissions in the past for recurrent syncope.  He had last  one on the day of his presentation to the emergency room when he was sitting  on the couch and his mother was next to him and all of a sudden he became  pale, diaphoretic, weak, and said I am going to pass out and he blacked  out.  He was taken to the emergency room at [facility].  There woke  up when emergency room physician rubbed the knuckles of the fingers against  the patient's chest.  He mentioned over there that Dr. Domingo Sep and Dr.  Graciela Husbands saw him in the past and based on this examination, the patient was  transferred to [facility].  It was suspected that part of his  medical problems could be related to psychogenic component and we requested  psychiatry consult along with electrophysiology consult.   HOSPITAL COURSE:  He was seen by Dr. Ladona Ridgel for bradycardia and recurrent  syncope and it was felt that ultimately pacemaker will likely be required,  but even despite a permanent pacemaker, the patient still may experience  syncope, but hopefully less frequently as before. Even would be related  secondary to a vasodepressive component of his syncope.  He probably should  be continued on Midodrine.  The patient  underwent a pacer placement on February 03, 2003.  It was performed by Darlin Priestly, M.D.  It was a [employer]  device serial number for atrial lead [id number] and serial number for  ventricular lead is [id number].  On the day of discharge, the patient's  pacer was interrogated and shows atrial threshold was 0.5 and ventricular  threshold of 0.5 volts.  Pulsewidth in atrial lead 0.4 and ventricular lead  0.4 milliseconds.  Impedence atrial 565, ventricular 728 ohms.  All valves  were within normal limits and it was interrogated by [employer]  Representative.   During this stay, we also requested psychiatric consult and due to the  patient's suicidal ideation, it was felt more safe for him to be transferred  to behavioral health center and continue therapy as an inpatient.  At this  time of his discharge, the patient refused to be  transferred to the  psychiatric unit. He says that he is not having suicidal ideation and he has  an appointment scheduled with his psychiatrist in [facility] on February 07, 2003, and he was strongly asserting that he was going to see the  psychiatrist and his mood was significantly elevated and he was looking  forward to being discharged and taking care of his family and social issues  that were pending prior to the discharge.  Dr. Jenne Campus assessed the patient  prior to discharge home and also found him to be stable from cardiovascular  standpoint and emotional standpoint to discharge him home and follow up with  his psychiatrist as outpatient and also follow up in our office for incision  checkup in one week.   DISCHARGE MEDICATIONS:  1. Midodrine 10 mg t.i.d.  2. Aspirin 81 mg daily.  3. Pepcid 20 mg a day.  4. Lexapro 10 mg daily.   ACTIVITY:  No driving up until seen by Dr. Jenne Campus in the office.  Also he  is given instructions to progress with his left arm movements over the week.  He was instructed to avoid wetting of the dressing of the incision  and was  allowed to have only sponge bath.  Appointment for pacer incision checkup  was scheduled on February 12, 2003, at 11:15 with Dr. Jenne Campus at our office and  phone number and directions to the office were provided.     Raymon Mutton, P.A.                    Darlin Priestly, M.D.    MK/MEDQ  D:  02/04/2003  T:  02/04/2003  Job:  [id number]   cc:   Annette Stable Hasanaj  [address]  [place]  [place] [address]  Fax: [phone]   Sarita Bottom, M.D.   Dani Gobble, MD  [phone]  [place]  [place] [address]  Fax: [phone]    cc:   Lia Hopping  [address]  [place]  [place] [address]  Fax: [phone]   Sarita Bottom, M.D.   Dani Gobble, MD  [phone]  [place]  [place] [address]  Fax: [phone]
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
## 2010-12-03 NOTE — Procedures (Signed)
   NAME:  Frank Powell, Frank Powell                          ACCOUNT NO.:  [id number]   MEDICAL RECORD NO.:  [id number]                   PATIENT TYPE:  INP   LOCATION:  A201                                 FACILITY:  [facility]   PHYSICIAN:  Kofi A. Gerilyn Pilgrim, M.D.              DATE OF BIRTH:  1961-03-30   DATE OF PROCEDURE:  DATE OF DISCHARGE:                                EEG INTERPRETATION   HISTORY:  The patient is a 50 year old who is suspected of having possible  seizures.   ANALYSIS:  A 16-channel recording is conducted for approximately 20 minutes.  There is a posterior rhythm of 10-10.5 Hz, which attenuate with eye-opening.  Awake and sleep activities are seen.  Stage 2 sleep with A complexes and  sleep spindles are seen.  Photic stimulation and hyperventilation do not  elicit any abnormal responses.  There are some artifactual high frequency  signals seen; however, there is no clear epileptiform activity seen.  There  is no focal slowing or lateralized slowing.   IMPRESSION:  This is a normal recording in the awake and sleep states.                                                 Kofi A. Gerilyn Pilgrim, M.D.    KAD/MEDQ  D:  04/04/2003  T:  04/04/2003  Job:  [id number]
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
## 2010-12-03 NOTE — H&P (Signed)
NAME:  Frank Powell, Frank Powell                          ACCOUNT NO.:  [id number]   MEDICAL RECORD NO.:  [id number]                   PATIENT TYPE:  INP   LOCATION:  A201                                 FACILITY:  [facility]   PHYSICIAN:  Kingsley Callander. Ouida Sills, M.D.                  DATE OF BIRTH:  1961-03-20   DATE OF ADMISSION:  04/03/2003  DATE OF DISCHARGE:                                HISTORY & PHYSICAL   CHIEF COMPLAINT:  Passed out.   HISTORY OF PRESENT ILLNESS:  This patient is a 50 year old white male who  presented to the emergency room by ambulance after passing out at church.  He was walking to his pew when he reportedly lost consciousness.  He has a  history of multiple similar episodes dating back to the early 1980s.  He has  been extensively evaluated by cardiology and in fact had a pacemaker placed  in July.  On evaluation in the emergency room though he was bradycardic into  the 40s and 50s and no evidence of pacemaker function was evident.  The  patient states he felt substernal chest tightness prior to his episode and  that he had some diaphoresis and nausea.  He did not vomit.  He did feel  some shortness of breath.  He gives a history of having had a cardiac  catheterization this past year which revealed no significant blockages.  The  patient takes midodrine t.i.d.   PAST MEDICAL HISTORY:  1. Multiple syncopal episodes status post pacemaker placement in July 2004.  2. Peptic ulcer disease.  3. Depression/anxiety.   MEDICATIONS:  1. Diazepam 10 mg q.i.d.  2. Lexapro 10 mg daily .  3. Ranitidine 150 mg b.i.d.  4. Midodrine 10 mg t.i.d.   ALLERGIES:  None.   SOCIAL HISTORY:  He denies tobacco use, alcohol use, or drug use.  He is  separated from his wife and reports a great deal of stress.  He is followed  [facility] at [facility].  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   FAMILY HISTORY:  His mother has diabetes.  His  father has diabetes and heart  disease   REVIEW OF SYSTEMS:  He is currently pain-free, states he feels sleepy after  each of these episodes.  Has not had a history of seizures, involuntary  muscle contractions, tongue biting, incontinence.  He has had chronic  diarrhea and has had colitis he states.  He has no difficulty voiding, blood  in the stool, or blood in his urine.   PHYSICAL EXAMINATION:  VITAL SIGNS:  Blood pressure 117/75, pulse 54,  respirations 16.  GENERAL:  Somewhat sleepy but arousable and coherent white male.  HEENT:  Atraumatic.  No sclerae icterus.  Pharynx is unremarkable.  NECK:  Supple with no carotid bruits, JVD, or thyromegaly.  LUNGS:  Clear.  HEART:  Bradycardic with no murmurs.  Has a pacemaker in the left upper  chest.  ABDOMEN:  Overweight, nontender, with no hepatosplenomegaly.  EXTREMITIES:  Normal pulses.  No clubbing or edema.  NEUROLOGIC:  Grossly intact.   LABORATORY DATA:  His chest x-ray reveals poor inspiratory effort but his  pacemaker leads appear to be in satisfactory condition.  His EKG reveals  sinus bradycardia at 54 beats per minute with first degree A-V block and a  leftward axis.  There are no acute ischemic changes.  White count 10,000;  hemoglobin 13.7; platelets 339.  His ABG reveals a pH of 7.39, PCO2 44, and  a PO2 of 63.  Sodium 137, potassium 3.7, glucose 86, BUN 12, creatinine 0.9.  CK 170, troponin 0.03.  SGOT 17.   IMPRESSION:  1. Syncope.  Has not shown any evidence of complete heart block on cardiac     monitoring.  It does not appear as though his pacemaker, though, is     kicking in at a rate below the 50-60 range.  He is being hospitalized for     cardiac monitoring and cardiology consultation.  Cardiac enzymes will be     repeated in eight hours.  2. History of depression and anxiety.  Continue Lexapro and diazepam.                                               Kingsley Callander. Ouida Sills, M.D.    ROF/MEDQ  D:  04/04/2003  T:   04/04/2003  Job:  [id number]   cc:   Annette Stable Hasanaj  [address]  [place]  [place] [address]  Fax: [phone]   Dani Gobble, MD  [phone] [address]  [place]  [place] [address]  Fax: [phone]

## 2010-12-03 NOTE — Cardiovascular Report (Signed)
NAME:  Frank Powell, Frank Powell NO.:  [id number]   MEDICAL RECORD NO.:  [id number]                   PATIENT TYPE:  INP   LOCATION:  4733                                 FACILITY:  [facility]   PHYSICIAN:  Darlin Priestly, M.D.             DATE OF BIRTH:  [birth year]   DATE OF PROCEDURE:  02/03/2003  DATE OF DISCHARGE:                              CARDIAC CATHETERIZATION   PROCEDURES:  Insertion of a Medtronic impulse generator with active atrial  and ventricular leads.   ATTENDING PHYSICIAN:  Darlin Priestly, M.D.   COMPLICATIONS:  None.   INDICATIONS:  [name] is a 50 year old male patient of Dr. Dani Gobble  with history of recurrent vasodepressive syncope.  The patient has had  multiple episodes of syncope which were documented with hypotension and  marked bradycardia.  Did have a negative tilt table test and has been on  multiple agents.  However, he continues to have recurrent symptoms.  He is  now referred for dual-chamber implant in attempt to prevent significant  bradycardic episodes.   DESCRIPTION OF OPERATION:  After obtaining informed written consent, the  patient was brought to the cardiac catheterization laboratory.  His left  chest was shaved, prepped and draped in sterile fashion.  ECG monitor was  established.  1% lidocaine was then used to anesthetize the left  subclavian  area.  Approximately 3-cm incision was performed in the mid to lateral  aspect of the left subclavicular area with electrocautery used to obtain  hemostasis.  Blunt dissection was used to carry this down to pectoral  fascia.  Approximately a 3 x 4 cm pacemaker pocket was then created over the  left pectoral fascia.  Again, hemostasis was obtained with electrocautery.  Next, under fluoroscopic guidance, the left subclavian vein was entered and  guide wire was then passed into the RA and RV.  A #9 French dilating sheath  was then easily passed over the guide wire and the  dilator and guide wire  were removed.  Next, an active Medtronic 58 cm lead, model number 5076,  serial number [id number] was then easily passed into the RV.  The retained  guide wire was then reinserted and the sheath was then peeled away.  A  second 9 French dilating sheath was then passed over the retained guide wire  and the guide wire and dilator were removed.  A second 52 cm active  Medtronic lead model number [id number], serial number [id number] was then passed  into the right atrium and the retained guide wire was then replaced in the  sheath.  The peel away sheath was then removed and the retained guide wire  was then fastened to the sheet with a mosquito clamp.  A J curve was then  placed on the ventricular stylet and the ventricular lead was then easily  positioned in the RVA apex.  The screw was then  extended and confirmed to be  extended. Thresholds were determined.  R waves were sensed at 14 mV.  Threshold intervention was 0.5 V at 0.5 msec.  Impedance was  825 ohms.  Current is 0.8 milliamps.  A J stylet was then placed in the atrial lead and  the atrial lead was then easily passed into the right apical appendage.  Screws were then confirmed to be extended and thresholds were determined in  the atrium.  P waves in the atrium were 3.3 mV.  Threshold in the atrium was  0.4 V at 0.5 msec.  Impedance was 650 ohms.  Current is 0.9 milliamps.  These leads were then sutured into place with two 2-0 silk sutures per lead.  The pocket was then copiously irrigated with 1% kanamycin solution.  Next,  an impulse E1DRO1 serial number [id number] H generator was then connected in  serial fashion with ventricular and atrial leads and head screws were  tightened and pacing was confirmed.  One single silk suture was then placed  at the apical portion of the pocket and the generator leads were then easily  delivered into the pocket.  The generator was then secured in place.  The  pocket was then closed  using running 2-0 Dexon for the subcu layer and  running 5-0 Dexon for the skin layer.  Steri-Strips were then applied.  The  patient was transferred to the recovery room in stable condition.   CONCLUSION:  Successful placement of a Medtronic impulse E1DRO1 serial  number [id number] generator with active atrial and ventricular leads.                                                   Darlin Priestly, M.D.    RHM/MEDQ  D:  02/03/2003  T:  02/03/2003  Job:  [id number]
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
## 2010-12-03 NOTE — Discharge Summary (Signed)
Frank Powell, Frank Powell                ACCOUNT NO.:  [id number]   MEDICAL RECORD NO.:  [id number]          PATIENT TYPE:  INP   LOCATION:  A315                          FACILITY:  [facility]   PHYSICIAN:  Margaretmary Dys, M.D.DATE OF BIRTH:  20-Jul-1960   DATE OF ADMISSION:  12/04/2008  DATE OF DISCHARGE:  05/22/2010LH                               DISCHARGE SUMMARY   DISCHARGE DIAGNOSES:  1. Chest pain with negative cardiac enzymes.  No evidence of acute      myocardial infarction.  2. Prior remote history of coronary artery disease.  3. History of hypertension, well-controlled.  4. History of pacemaker insertion.  5. New onset type 2 diabetes mellitus.   DISCHARGE MEDICATIONS:  1. Geodon 60 mg p.o. once a day.  2. Lantus 20 units subcutaneously once a day.   CONSULTATIONS DURING COURSE OF HOSPITALIZATION:  [facility].   PERTINENT LABORATORY DATA:  On admission during the course of  hospitalization, white blood count 10.3, hemoglobin of 15.4, hematocrit  42.7, and platelet count was 242.  Sodium was 131, potassium 3.3,  chloride of 95, CO2 26, glucose 413, BUN of 6, creatinine was 0.9, and  INR was 0.9.  Cardiac enzymes were negative.  Chest x-ray shows no acute  cardiopulmonary abnormalities.   The 12-lead EKG shows normal sinus rhythm with no acute ST-T changes,  but possible pulmonary disease pattern with P pulmonale noted.   DIET:  Heart-healthy diet.   ACTIVITIES:  As tolerated.   SPECIAL INSTRUCTIONS/PRECAUTIONS:  The patient was advised to return to  the emergency room if his chest pain recurs or if he has concerns bout  his blood sugar control.   HOSPITAL COURSE:  Frank Powell is a 50 year old male presenting with some  chest discomfort.  Apparently the patient reported noting some  discomfort in his left chest.  The patient's pain had no radiation.  He  described it as somewhat like a pressure which has been 9/10 at its  worst.   When the patient arrived in the  emergency room, the patient's pain was  completely relieved when he was seen by the hospitalist.  Kindly refer  to Dr. Claudean Severance dictation for history and physical.  The patient had  been having some cough, some dyspnea and wheezing.  The patient does  have positive history of smoking.  The patient was subsequently  admitted.  His vital signs on physical exam were unremarkable.  His  blood pressure was 110/70 with a pulse of 64, and respiration was 20.  His temperature was 98.1 degrees Fahrenheit.  Oxygen saturation was 98%  on room air.   The patient was then seen by cardiology, Dr. Julien Nordmann, who then  scheduled him for the stress test for the following week of discharge.   The patient was then recommended for discharge.  For the patient's new  onset diabetes, the patient was started on Lantus insulin and given a  followup note to follow with his primary care physician.   DISPOSITION:  The patient is now being discharged home in stable  condition.  Margaretmary Dys, M.D.  Electronically Signed     AM/MEDQ  D:  01/24/2009  T:  01/24/2009  Job:  [id number]
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
## 2010-12-03 NOTE — Discharge Summary (Signed)
NAME:  Frank Powell, Frank Powell                          ACCOUNT NO.:  [id number]   MEDICAL RECORD NO.:  [id number]                   PATIENT TYPE:  OBV   LOCATION:  6527                                 FACILITY:  [facility]   PHYSICIAN:  Kem Boroughs, M.D.                 DATE OF BIRTH:  [birth year]   DATE OF ADMISSION:  01/24/2003  DATE OF DISCHARGE:  01/25/2003                                 DISCHARGE SUMMARY   DISCHARGE DIAGNOSES:  1. Recurrent syncope, suspected that this is psychogenic.  2. History of orthostasis, on midodrine.  3. Gastroesophageal reflux.   HOSPITAL COURSE:  The patient is a 50 year old male who has had multiple  admissions for syncope.  He has been evaluated over the past few months; I  believe he has had eight admissions since March.  He has been seen by the EP  service, Dr. Ladona Ridgel.  He presented on January 24, 2003, with another episode of  syncope while at home laying on the couch.  The patient's family said he was  unresponsive for 10 minutes.  There were no tonic-clonic movements or  incontinence.  He is admitted to telemetry.  He did not have any arrhythmia.  Catheterization done in March showed normal coronaries and normal LV  function.  Tilt table was negative also in March.  The patient has had an  event monitor without significant findings.  Echocardiogram has been normal.  The patient was admitted to telemetry for observation.  He was seen again by  Dr. Graciela Husbands who suggested that there was a strong psychogenic component.  The  patient was seen in consult by the neurology service who agreed with this,  and suggested outpatient psychiatric evaluation.  The patient was seen by  Dr. Jacinto Halim on January 25, 2003.  In discussion with Dr. Jacinto Halim, the patient  admitted that he is aware when he passes out.  He says he keeps his eyes  tightly closed.  Dr. Jacinto Halim felt his symptoms were suggestive of conversion  disorder.  The plan is to discharge him, and he will follow up with Dr.  Olena Leatherwood who is to see him in [place].  He will need to be set up for a  psychiatric evaluation.  Dr. Jacinto Halim did feel that he could go back to work  half-time, the patient does want to return to work.  His midodrine was  increased during this admission.   DISCHARGE MEDICATIONS:  1. Midodrine 10 mg t.i.d.  2. Lexapro 10 mg daily.  3. Aspirin daily.  4. Pepcid 20 mg b.i.d.   LABORATORY DATA:  CK, MB, and troponin are negative.  Sodium 136, potassium  was 3.0; this was replaced.  BUN 9, creatinine 0.9.  Liver functions are  normal.  White count 9.2, hemoglobin 12.9, hematocrit 36.5, platelets 281.  INR is 1.0.  EKG shows sinus rhythm, sinus bradycardia at 50.   PLAN:  The patient is discharged in stable condition, and will follow up  with Dr. Olena Leatherwood in [place].  He will need to be set up for a psychiatric  evaluation as an outpatient.     Abelino Derrick, P.A.                      Kem Boroughs, M.D.    Lenard Lance  D:  01/25/2003  T:  01/26/2003  Job:  [id number]   cc:   Annette Stable Hasanaj  [address]  [place]  [place] [address]  Fax: [phone]   Doylene Canning. Ladona Ridgel, M.D.    cc:   Annette Stable Hasanaj  [address]  [place]  [place] [address]  Fax: [phone]   Doylene Canning. Ladona Ridgel, M.D.

## 2011-10-06 HISTORY — PX: NM MYOCAR PERF WALL MOTION: HXRAD629

## 2011-12-29 ENCOUNTER — Emergency Department (HOSPITAL_COMMUNITY)
Admission: EM | Admit: 2011-12-29 | Discharge: 2011-12-29 | Disposition: A | Discharge status: Home / Self Care | Payer: PRIVATE HEALTH INSURANCE | Attending: Emergency Medicine | Admitting: Emergency Medicine

## 2011-12-29 ENCOUNTER — Encounter (HOSPITAL_COMMUNITY): Payer: Self-pay

## 2011-12-29 ENCOUNTER — Emergency Department (HOSPITAL_COMMUNITY): Payer: PRIVATE HEALTH INSURANCE

## 2011-12-29 DIAGNOSIS — I4892 Unspecified atrial flutter: Secondary | ICD-10-CM | POA: Insufficient documentation

## 2011-12-29 DIAGNOSIS — R079 Chest pain, unspecified: Secondary | ICD-10-CM | POA: Insufficient documentation

## 2011-12-29 DIAGNOSIS — R0789 Other chest pain: Secondary | ICD-10-CM

## 2011-12-29 DIAGNOSIS — F17200 Nicotine dependence, unspecified, uncomplicated: Secondary | ICD-10-CM | POA: Insufficient documentation

## 2011-12-29 DIAGNOSIS — E119 Type 2 diabetes mellitus without complications: Secondary | ICD-10-CM | POA: Insufficient documentation

## 2011-12-29 DIAGNOSIS — I252 Old myocardial infarction: Secondary | ICD-10-CM | POA: Insufficient documentation

## 2011-12-29 DIAGNOSIS — F319 Bipolar disorder, unspecified: Secondary | ICD-10-CM | POA: Insufficient documentation

## 2011-12-29 DIAGNOSIS — Z91013 Allergy to seafood: Secondary | ICD-10-CM | POA: Insufficient documentation

## 2011-12-29 DIAGNOSIS — Z950 Presence of cardiac pacemaker: Secondary | ICD-10-CM | POA: Insufficient documentation

## 2011-12-29 DIAGNOSIS — I4891 Unspecified atrial fibrillation: Secondary | ICD-10-CM | POA: Insufficient documentation

## 2011-12-29 HISTORY — DX: Acute myocardial infarction, unspecified: I21.9

## 2011-12-29 HISTORY — DX: Bipolar disorder, unspecified: F31.9

## 2011-12-29 HISTORY — DX: Dissociative identity disorder: F44.81

## 2011-12-29 HISTORY — DX: Unspecified atrial fibrillation: I48.91

## 2011-12-29 HISTORY — DX: Reserved for concepts with insufficient information to code with codable children: IMO0002

## 2011-12-29 MED ORDER — OXYCODONE-ACETAMINOPHEN 7.5-500 MG PO TABS: 1.0000 | ORAL_TABLET | Freq: Four times a day (QID) | ORAL | Status: AC | PRN

## 2011-12-29 MED ORDER — HYDROMORPHONE HCL PF 1 MG/ML IJ SOLN: 1.0000 mg | Freq: Once | INTRAMUSCULAR | Status: DC

## 2011-12-29 NOTE — ED Provider Notes (Signed)
History   This chart was scribed for Donnetta Hutching, MD by Clarita Crane. The patient was seen in room APA01/APA01. Patient's care was started at 1249.    CSN: [id number]  Arrival date & time 12/29/11  1249   First MD Initiated Contact with Patient 12/29/11 1430      Chief Complaint  Patient presents with  . Illegal value: [    Rib pain    (Consider location/radiation/quality/duration/timing/severity/associated sxs/prior treatment) HPI Frank Powell is a 51 y.o. male who presents to the Emergency Department complaining of waxing and waning moderate right sided rib pain onset 3 days ago after a coughing episode and persistent since. Patient notes right sided rib pain is aggravated with coughing. Reports pain not relieved with Tylenol and Aspirin. Denies nausea, vomiting, fever, chills. Patient with h/o diabetes, a-fib/flutter, MI.   Past Medical History  Diagnosis Date  . Bipolar 1 disorder   . Multiple personality   . Diabetes mellitus   . Atrial fib/flutter, transient   . Myocardial infarct     Past Surgical History  Procedure Date  . Pacemaker insertion     No family history on file.  History  Substance Use Topics  . Smoking status: Current Everyday Smoker  . Smokeless tobacco: Not on file  . Alcohol Use: No      Review of Systems A complete 10 system review of systems was obtained and all systems are negative except as noted in the HPI and PMH.   Allergies  Shellfish allergy  Home Medications   Current Outpatient Rx  Name Route Sig Dispense Refill  . ALPRAZOLAM 1 MG PO TABS Oral Take 1 mg by mouth 5 (five) times daily.    . FENOFIBRATE 48 MG PO TABS Oral Take 48 mg by mouth at bedtime.    Marland Kitchen FLUTICASONE-SALMETEROL 250-50 MCG/DOSE IN AEPB Inhalation Inhale 1 puff into the lungs every 12 (twelve) hours.    Marland Kitchen GABAPENTIN 300 MG PO CAPS Oral Take 300-600 mg by mouth 2 (two) times daily. Takes one capsule in am and 2 capsules at night    . QUETIAPINE FUMARATE ER  400 MG PO TB24 Oral Take 400 mg by mouth at bedtime.    . TRAZODONE HCL 150 MG PO TABS Oral Take 300 mg by mouth at bedtime.    Marland Kitchen ZIPRASIDONE HCL 60 MG PO CAPS Oral Take 60 mg by mouth 2 (two) times daily with a meal.      BP 140/88  Pulse 100  Temp 97.9 F (36.6 C) (Oral)  Resp 18  Ht 5\' 10"  (1.778 m)  Wt 235 lb (106.595 kg)  BMI 33.72 kg/m2  SpO2 98%  Physical Exam  Nursing note and vitals reviewed. Constitutional: He is oriented to person, place, and time. He appears well-developed and well-nourished. No distress.  HENT:  Head: Normocephalic and atraumatic.  Eyes: EOM are normal. Pupils are equal, round, and reactive to light.  Neck: Neck supple. No tracheal deviation present.  Cardiovascular: Normal rate and regular rhythm.   No murmur heard. Pulmonary/Chest: Effort normal. No respiratory distress. He has no wheezes. He exhibits tenderness.       Right inferior lateral rib tenderness.   Abdominal: Soft. He exhibits no distension.  Musculoskeletal: Normal range of motion. He exhibits no edema.  Neurological: He is alert and oriented to person, place, and time. No sensory deficit.  Skin: Skin is warm and dry.  Psychiatric: He has a normal mood and affect. His behavior is normal.  ED Course  Procedures (including critical care time)  DIAGNOSTIC STUDIES: Oxygen Saturation is 98% on room air, normal by my interpretation.    COORDINATION OF CARE: 2:37PM-X-ray of ribs, pain medication to be administered.    Labs Reviewed - No data to display No results found.   No diagnosis found.  Dg Ribs Unilateral W/chest Right  12/29/2011  *RADIOLOGY REPORT*  Clinical Data: Right anterior rib pain.  Felt a pop while coughing.  RIGHT RIBS AND CHEST - 3+ VIEW  Comparison: None.  Findings: Right basilar atelectasis is present, likely secondary to splinting.  No displaced rib fracture is identified.  Marker was applied over the region of the lateral right 10th rib.  No pneumothorax.   Cardiopericardial silhouette appears within normal limits.  Left lung normal.  Dual lead left subclavian cardiac pacemaker appears unchanged.  IMPRESSION: No displaced rib fracture or pneumothorax.  Right basilar atelectasis, likely secondary to splinting.  Original Report Authenticated By: Andreas Newport, M.D.    MDM  Xray normal.  D/C home c Percocet 7.5/500  #20      I personally performed the services described in this documentation, which was scribed in my presence. The recorded information has been reviewed and considered.    Donnetta Hutching, MD 12/29/11 [phone]

## 2011-12-29 NOTE — ED Notes (Signed)
Pt had a coughing spell Monday evening and felt a "pop" to his right rib per pt.
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
## 2011-12-29 NOTE — Discharge Instructions (Signed)
Chest Wall Pain Chest wall pain is pain in or around the bones and muscles of your chest. It may take up to 6 weeks to get better. It may take longer if you must stay physically active in your work and activities.  CAUSES  Chest wall pain may happen on its own. However, it may be caused by:  A viral illness like the flu.   Injury.   Coughing.   Exercise.   Arthritis.   Fibromyalgia.   Shingles.  HOME CARE INSTRUCTIONS   Avoid overtiring physical activity. Try not to strain or perform activities that cause pain. This includes any activities using your chest or your abdominal and side muscles, especially if heavy weights are used.   Put ice on the sore area.   Put ice in a plastic bag.   Place a towel between your skin and the bag.   Leave the ice on for 15 to 20 minutes per hour while awake for the first 2 days.   Only take over-the-counter or prescription medicines for pain, discomfort, or fever as directed by your caregiver.  SEEK IMMEDIATE MEDICAL CARE IF:   Your pain increases, or you are very uncomfortable.   You have a fever.   Your chest pain becomes worse.   You have new, unexplained symptoms.   You have nausea or vomiting.   You feel sweaty or lightheaded.   You have a cough with phlegm (sputum), or you cough up blood.  MAKE SURE YOU:   Understand these instructions.   Will watch your condition.   Will get help right away if you are not doing well or get worse.  Document Released: 07/04/2005 Document Revised: 06/23/2011 Document Reviewed: 02/28/2011 [facility] Patient Information 2012 [place], [place].   Xray is normal.  Will be sore for several days.  Pain meds.  Use pillow to brace yourself when coughing

## 2012-09-17 ENCOUNTER — Other Ambulatory Visit: Payer: Self-pay | Admitting: Cardiovascular Disease

## 2012-09-24 ENCOUNTER — Encounter (HOSPITAL_COMMUNITY): Payer: Self-pay

## 2012-09-25 ENCOUNTER — Ambulatory Visit (HOSPITAL_COMMUNITY)
Admission: RE | Admit: 2012-09-25 | Discharge: 2012-09-25 | Disposition: A | Discharge status: Home / Self Care | Payer: PRIVATE HEALTH INSURANCE | Source: Ambulatory Visit | Attending: Cardiovascular Disease | Admitting: Cardiovascular Disease

## 2012-09-25 ENCOUNTER — Other Ambulatory Visit (HOSPITAL_COMMUNITY): Payer: Self-pay | Admitting: Cardiovascular Disease

## 2012-09-25 DIAGNOSIS — R079 Chest pain, unspecified: Secondary | ICD-10-CM | POA: Insufficient documentation

## 2012-09-25 DIAGNOSIS — Z01811 Encounter for preprocedural respiratory examination: Secondary | ICD-10-CM

## 2012-09-25 DIAGNOSIS — Z01818 Encounter for other preprocedural examination: Secondary | ICD-10-CM | POA: Insufficient documentation

## 2012-09-27 MED ORDER — SODIUM CHLORIDE 0.9 % IV SOLN: 250.0000 mL | INTRAVENOUS | Status: DC

## 2012-09-27 MED ORDER — SODIUM CHLORIDE 0.45 % IV SOLN
INTRAVENOUS | Status: DC
  Administered 2012-09-28: 08:00:00 via INTRAVENOUS

## 2012-09-27 MED ORDER — DEXTROSE 5 % IV SOLN
3.0000 g | INTRAVENOUS | Status: DC
  Filled 2012-09-27: qty 3000

## 2012-09-27 MED ORDER — SODIUM CHLORIDE 0.9 % IR SOLN
80.0000 mg | Status: DC
  Filled 2012-09-27: qty 2

## 2012-09-27 MED ORDER — SODIUM CHLORIDE 0.9 % IJ SOLN: 3.0000 mL | Freq: Two times a day (BID) | INTRAMUSCULAR | Status: DC

## 2012-09-27 MED ORDER — SODIUM CHLORIDE 0.9 % IJ SOLN: 3.0000 mL | INTRAMUSCULAR | Status: DC | PRN

## 2012-09-28 ENCOUNTER — Encounter (HOSPITAL_COMMUNITY)
Admission: RE | Disposition: A | Discharge status: Home / Self Care | Payer: Self-pay | Source: Ambulatory Visit | Attending: Cardiovascular Disease

## 2012-09-28 ENCOUNTER — Ambulatory Visit (HOSPITAL_COMMUNITY)
Admission: RE | Admit: 2012-09-28 | Discharge: 2012-09-28 | Disposition: A | Discharge status: Home / Self Care | Payer: PRIVATE HEALTH INSURANCE | Source: Ambulatory Visit | Attending: Cardiovascular Disease | Admitting: Cardiovascular Disease

## 2012-09-28 DIAGNOSIS — E669 Obesity, unspecified: Secondary | ICD-10-CM | POA: Insufficient documentation

## 2012-09-28 DIAGNOSIS — Z45018 Encounter for adjustment and management of other part of cardiac pacemaker: Secondary | ICD-10-CM | POA: Insufficient documentation

## 2012-09-28 HISTORY — PX: PERMANENT PACEMAKER GENERATOR CHANGE: SHX6022

## 2012-09-28 LAB — SURGICAL PCR SCREEN
MRSA, PCR: NEGATIVE
Staphylococcus aureus: NEGATIVE

## 2012-09-28 SURGERY — PERMANENT PACEMAKER GENERATOR CHANGE
Anesthesia: LOCAL

## 2012-09-28 MED ORDER — ACETAMINOPHEN 325 MG PO TABS: 325.0000 mg | ORAL_TABLET | ORAL | Status: DC | PRN

## 2012-09-28 MED ORDER — MUPIROCIN 2 % EX OINT
TOPICAL_OINTMENT | Freq: Two times a day (BID) | CUTANEOUS | Status: DC
  Filled 2012-09-28: qty 22

## 2012-09-28 MED ORDER — MUPIROCIN 2 % EX OINT
TOPICAL_OINTMENT | CUTANEOUS | Status: AC
  Administered 2012-09-28: 1
  Filled 2012-09-28: qty 22

## 2012-09-28 MED ORDER — MIDAZOLAM HCL 2 MG/2ML IJ SOLN
INTRAMUSCULAR | Status: AC
  Filled 2012-09-28: qty 2

## 2012-09-28 MED ORDER — OXYCODONE-ACETAMINOPHEN 7.5-325 MG PO TABS: 1.0000 | ORAL_TABLET | ORAL | Status: DC | PRN

## 2012-09-28 MED ORDER — HYDROCODONE-ACETAMINOPHEN 5-325 MG PO TABS: 1.0000 | ORAL_TABLET | ORAL | Status: DC | PRN

## 2012-09-28 MED ORDER — FENTANYL CITRATE 0.05 MG/ML IJ SOLN
INTRAMUSCULAR | Status: AC
  Filled 2012-09-28: qty 2

## 2012-09-28 MED ORDER — LIDOCAINE HCL (PF) 1 % IJ SOLN
INTRAMUSCULAR | Status: AC
  Filled 2012-09-28: qty 60

## 2012-09-28 MED ORDER — ONDANSETRON HCL 4 MG/2ML IJ SOLN: 4.0000 mg | Freq: Four times a day (QID) | INTRAMUSCULAR | Status: DC | PRN

## 2012-09-28 MED ORDER — SODIUM CHLORIDE 0.45 % IV SOLN: INTRAVENOUS | Status: DC

## 2012-09-28 NOTE — H&P (Signed)
  Date of Initial H&P: 09/17/12 - Dr. Gerlene Burdock A. Alanda Amass, MD  History reviewed, patient examined, no change in status, stable for surgery. Generator change due to device at [facility]. This procedure has been fully reviewed with the patient and written informed consent has been obtained.  Thurmon Fair, MD, [facility] and [facility] [phone] office [phone] pager

## 2012-09-28 NOTE — CV Procedure (Signed)
Procedure report  Procedure performed:  1. Dual chamber pacemaker generator changeout  2. Light sedation  Reason for procedure:  1. Device generator at elective replacement interval  Procedure performed by:  Thurmon Fair, MD  Complications:  None  Estimated blood loss:  <5 mL  Medications administered during procedure:  Ancef 3 g intravenously,lidocaine 1% 30 mL locally, fentanyl 50 mcg intravenously, Versed 2 mg intravenously Device details:  New Generator Medtronic Adapta L model number ADDRL1, serial number [id number] Right atrial lead (chronic) Medtronic [id number], serial [id number] (implanted 02/03/2003) Right ventricular lead (chronic)  Medtronic [id number], serial number [id number] (implanted 02/03/2003)  Explanted generator Medtronic Enpulse,  model number [id number], serial number  [id number] (implanted 02/03/2003)  Procedure details:  After the risks and benefits of the procedure were discussed the patient provided informed consent. She was brought to the cardiac catheter lab in the fasting state. The patient was prepped and draped in usual sterile fashion. Local anesthesia with 1% lidocaine was administered to to the left infraclavicular area. A 5-6cm horizontal incision was made parallel with and 2-3 cm caudal to the left clavicle, in the area of an old scar. Using minimal electrocautery and mostly sharp and blunt dissection the prepectoral pocket was opened carefully to avoid injury to the loops of chronic leads. Extensive dissection was not necessary. The device was explanted. The pocket was carefully inspected for hemostasis and flushed with copious amounts of antibiotic solution.  The leads were disconnected from the old generator and testing of the lead parameters later showed excellent values. The new generator was connected to the chronic leads, with appropriate pacing noted.   The entire system was then carefully inserted in the pocket with care been taking that the leads  and device assumed a comfortable position without pressure on the incision. Great care was taken that the leads be located deep to the generator. The pocket was then closed in layers using 2 layers of 2-0 Vicryl and cutaneous staples after which a sterile dressing was applied.   At the end of the procedure the following lead parameters were encountered:   Right atrial lead sensed P waves 6.9 mV, impedance 494 ohms, threshold 0.3V at 0.5 ms pulse width.  Right ventricular lead sensed R waves  12.9 mV, impedance 486 ohms, threshold 0.9V at 0.5 ms pulse width.   Thurmon Fair, MD, [facility] and Vascular Center [phone] office [phone] pager  Cc: Pearletha Furl. Alanda Amass, MD

## 2012-11-05 ENCOUNTER — Encounter: Payer: Self-pay | Admitting: *Deleted

## 2013-01-21 LAB — PACEMAKER DEVICE OBSERVATION

## 2013-01-22 ENCOUNTER — Encounter: Payer: Self-pay | Admitting: Cardiovascular Disease

## 2013-01-22 ENCOUNTER — Other Ambulatory Visit: Payer: Self-pay | Admitting: *Deleted

## 2013-01-22 DIAGNOSIS — R011 Cardiac murmur, unspecified: Secondary | ICD-10-CM

## 2013-01-22 DIAGNOSIS — R55 Syncope and collapse: Secondary | ICD-10-CM

## 2013-01-24 ENCOUNTER — Other Ambulatory Visit: Payer: Self-pay | Admitting: Cardiovascular Disease

## 2013-01-24 LAB — COMPREHENSIVE METABOLIC PANEL
ALT: 20 U/L (ref 0–53)
AST: 12 U/L (ref 0–37)
Albumin: 3.9 g/dL (ref 3.5–5.2)
Alkaline Phosphatase: 109 U/L (ref 39–117)
BUN: 9 mg/dL (ref 6–23)
CO2: 26 mEq/L (ref 19–32)
Calcium: 8.8 mg/dL (ref 8.4–10.5)
Chloride: 97 mEq/L (ref 96–112)
Creat: 1.02 mg/dL (ref 0.50–1.35)
Glucose, Bld: 394 mg/dL — ABNORMAL HIGH (ref 70–99)
Potassium: 3.7 mEq/L (ref 3.5–5.3)
Sodium: 130 mEq/L — ABNORMAL LOW (ref 135–145)
Total Bilirubin: 0.3 mg/dL (ref 0.3–1.2)
Total Protein: 5.9 g/dL — ABNORMAL LOW (ref 6.0–8.3)

## 2013-01-24 LAB — CBC WITH DIFFERENTIAL/PLATELET
Basophils Absolute: 0 10*3/uL (ref 0.0–0.1)
Basophils Relative: 0 % (ref 0–1)
Eosinophils Absolute: 0.3 10*3/uL (ref 0.0–0.7)
Eosinophils Relative: 3 % (ref 0–5)
HCT: 40.5 % (ref 39.0–52.0)
Hemoglobin: 13.7 g/dL (ref 13.0–17.0)
Lymphocytes Relative: 18 % (ref 12–46)
Lymphs Abs: 2.4 10*3/uL (ref 0.7–4.0)
MCH: 31 pg (ref 26.0–34.0)
MCHC: 33.8 g/dL (ref 30.0–36.0)
MCV: 91.6 fL (ref 78.0–100.0)
Monocytes Absolute: 0.9 10*3/uL (ref 0.1–1.0)
Monocytes Relative: 7 % (ref 3–12)
Neutro Abs: 9.7 10*3/uL — ABNORMAL HIGH (ref 1.7–7.7)
Neutrophils Relative %: 72 % (ref 43–77)
Platelets: 319 10*3/uL (ref 150–400)
RBC: 4.42 MIL/uL (ref 4.22–5.81)
RDW: 13.3 % (ref 11.5–15.5)
WBC: 13.4 10*3/uL — ABNORMAL HIGH (ref 4.0–10.5)

## 2013-01-24 LAB — LIPID PANEL
Cholesterol: 175 mg/dL (ref 0–200)
HDL: 33 mg/dL — ABNORMAL LOW (ref >39)
LDL Cholesterol: 63 mg/dL (ref 0–99)
Total CHOL/HDL Ratio: 5.3 Ratio
Triglycerides: 397 mg/dL — ABNORMAL HIGH (ref <150)
VLDL: 79 mg/dL — ABNORMAL HIGH (ref 0–40)

## 2013-01-24 LAB — TSH: TSH: 2.017 u[IU]/mL (ref 0.350–4.500)

## 2013-01-24 LAB — HEMOGLOBIN A1C
Hgb A1c MFr Bld: 8.6 % — ABNORMAL HIGH (ref <5.7)
Mean Plasma Glucose: 200 mg/dL — ABNORMAL HIGH (ref <117)

## 2013-01-25 ENCOUNTER — Ambulatory Visit (HOSPITAL_COMMUNITY): Payer: PRIVATE HEALTH INSURANCE

## 2013-01-28 ENCOUNTER — Other Ambulatory Visit (HOSPITAL_COMMUNITY): Payer: PRIVATE HEALTH INSURANCE

## 2013-01-29 ENCOUNTER — Ambulatory Visit (HOSPITAL_COMMUNITY)
Admission: RE | Admit: 2013-01-29 | Discharge: 2013-01-29 | Disposition: A | Discharge status: Home / Self Care | Payer: PRIVATE HEALTH INSURANCE | Source: Ambulatory Visit | Attending: Cardiovascular Disease | Admitting: Cardiovascular Disease

## 2013-01-29 DIAGNOSIS — R55 Syncope and collapse: Secondary | ICD-10-CM | POA: Insufficient documentation

## 2013-01-29 DIAGNOSIS — F17200 Nicotine dependence, unspecified, uncomplicated: Secondary | ICD-10-CM | POA: Insufficient documentation

## 2013-01-29 DIAGNOSIS — R011 Cardiac murmur, unspecified: Secondary | ICD-10-CM | POA: Insufficient documentation

## 2013-01-29 NOTE — Progress Notes (Signed)
*  PRELIMINARY RESULTS* Echocardiogram 2D Echocardiogram has been performed.  Conrad [employer] 01/29/2013, 11:53 AM

## 2013-05-28 ENCOUNTER — Ambulatory Visit (INDEPENDENT_AMBULATORY_CARE_PROVIDER_SITE_OTHER): Payer: PRIVATE HEALTH INSURANCE | Admitting: Cardiovascular Disease

## 2013-05-28 ENCOUNTER — Encounter: Payer: Self-pay | Admitting: Cardiovascular Disease

## 2013-05-28 VITALS — BP 126/80 | HR 93 | Resp 20 | Ht 70.0 in | Wt 220.5 lb

## 2013-05-28 DIAGNOSIS — IMO0001 Reserved for inherently not codable concepts without codable children: Secondary | ICD-10-CM

## 2013-05-28 DIAGNOSIS — Z0389 Encounter for observation for other suspected diseases and conditions ruled out: Secondary | ICD-10-CM

## 2013-05-28 DIAGNOSIS — Z950 Presence of cardiac pacemaker: Secondary | ICD-10-CM

## 2013-05-28 DIAGNOSIS — E781 Pure hyperglyceridemia: Secondary | ICD-10-CM

## 2013-05-28 DIAGNOSIS — R55 Syncope and collapse: Secondary | ICD-10-CM

## 2013-05-28 DIAGNOSIS — R9431 Abnormal electrocardiogram [ECG] [EKG]: Secondary | ICD-10-CM

## 2013-05-28 DIAGNOSIS — I2510 Atherosclerotic heart disease of native coronary artery without angina pectoris: Secondary | ICD-10-CM

## 2013-05-28 DIAGNOSIS — E119 Type 2 diabetes mellitus without complications: Secondary | ICD-10-CM

## 2013-05-28 DIAGNOSIS — E669 Obesity, unspecified: Secondary | ICD-10-CM

## 2013-05-28 LAB — PACEMAKER DEVICE OBSERVATION

## 2013-05-28 NOTE — Patient Instructions (Signed)
Your physician recommends that you schedule a follow-up appointment in: 3 month Pacer Check

## 2013-05-29 LAB — MDC_IDC_ENUM_SESS_TYPE_INCLINIC
Battery Impedance: 110 Ohm
Battery Remaining Longevity: 14.5
Battery Voltage: 2.79 V
Brady Statistic RA Percent Paced: 0.2 %
Brady Statistic RV Percent Paced: 0.5 %
Lead Channel Impedance Value: 545 Ohm
Lead Channel Impedance Value: 610 Ohm
Lead Channel Pacing Threshold Amplitude: 0.5 V
Lead Channel Pacing Threshold Amplitude: 1 V
Lead Channel Pacing Threshold Pulse Width: 0.4 ms
Lead Channel Pacing Threshold Pulse Width: 0.4 ms
Lead Channel Sensing Intrinsic Amplitude: 11.2 mV
Lead Channel Sensing Intrinsic Amplitude: 2.8 mV
Lead Channel Setting Pacing Amplitude: 1.5 V
Lead Channel Setting Pacing Amplitude: 2 V
Lead Channel Setting Pacing Pulse Width: 0.4 ms
Lead Channel Setting Sensing Sensitivity: 5.6 mV

## 2013-05-30 ENCOUNTER — Encounter: Payer: Self-pay | Admitting: Internal Medicine

## 2013-05-30 ENCOUNTER — Telehealth: Payer: Self-pay | Admitting: Internal Medicine

## 2013-05-30 NOTE — Telephone Encounter (Signed)
Pt has feb recall in for dr c, pt lives in [place], called pt to offer allred in [place], both numbers n/a and voicemails that have not been set up yet, unable to leave message, will send schedule  Letter/mt

## 2013-06-02 ENCOUNTER — Encounter: Payer: Self-pay | Admitting: Cardiovascular Disease

## 2013-06-02 DIAGNOSIS — E66811 Obesity, class 1: Secondary | ICD-10-CM

## 2013-06-02 DIAGNOSIS — E785 Hyperlipidemia, unspecified: Secondary | ICD-10-CM | POA: Insufficient documentation

## 2013-06-02 DIAGNOSIS — R9431 Abnormal electrocardiogram [ECG] [EKG]: Secondary | ICD-10-CM | POA: Insufficient documentation

## 2013-06-02 DIAGNOSIS — E781 Pure hyperglyceridemia: Secondary | ICD-10-CM

## 2013-06-02 DIAGNOSIS — E669 Obesity, unspecified: Secondary | ICD-10-CM

## 2013-06-02 DIAGNOSIS — E119 Type 2 diabetes mellitus without complications: Secondary | ICD-10-CM

## 2013-06-02 DIAGNOSIS — Z950 Presence of cardiac pacemaker: Secondary | ICD-10-CM

## 2013-06-02 DIAGNOSIS — R55 Syncope and collapse: Secondary | ICD-10-CM | POA: Insufficient documentation

## 2013-06-02 HISTORY — DX: Obesity, class 1: E66.811

## 2013-06-02 HISTORY — DX: Obesity, unspecified: E66.9

## 2013-06-02 HISTORY — DX: Presence of cardiac pacemaker: Z95.0

## 2013-06-02 HISTORY — DX: Syncope and collapse: R55

## 2013-06-02 HISTORY — DX: Pure hyperglyceridemia: E78.1

## 2013-06-02 HISTORY — DX: Type 2 diabetes mellitus without complications: E11.9

## 2013-06-02 NOTE — Assessment & Plan Note (Signed)
No recurrence since pacemaker implant

## 2013-06-02 NOTE — Progress Notes (Signed)
Patient ID: Frank Powell, male   DOB: March 27, 1961, 52 y.o.   MRN: [id number]      Reason for office visit Pacemaker, syncope  I first met Frank Powell and I performed a generator change out for his dual chamber permanent pacemaker in March of this year. The devic asymptomatice was initially implanted in 2004 for neurocardiogenic syncope with profound cardioinhibitory component. His device is functioning normally. Ever since the pacemaker was implanted he stopped having syncope. He is obese and has poorly controlled diabetes mellitus. He has excellent cholesterol levels but has hypertriglyceridemia related to the poorly controlled diabetes. He is asymptomatic. Pacemaker check shows normal function appear    Allergies  Allergen Reactions  . Shellfish Allergy Nausea And Vomiting    Current Outpatient Prescriptions  Medication Sig Dispense Refill  . ALPRAZolam (XANAX) 1 MG tablet Take 1 mg by mouth 4 (four) times daily.       . fenofibrate (TRICOR) 48 MG tablet Take 48 mg by mouth at bedtime.      . Fluticasone-Salmeterol (ADVAIR) 250-50 MCG/DOSE AEPB Inhale 1 puff into the lungs every 12 (twelve) hours.      . gabapentin (NEURONTIN) 300 MG capsule Take 300-600 mg by mouth 3 (three) times daily. Takes 1 capsule in am and at lunch and 2 capsules at night      . QUEtiapine (SEROQUEL XR) 400 MG 24 hr tablet Take 400 mg by mouth at bedtime.      . ranitidine (ZANTAC) 150 MG tablet Take 150 mg by mouth 2 (two) times daily.      . traZODone (DESYREL) 150 MG tablet Take 300 mg by mouth at bedtime.      . ziprasidone (GEODON) 60 MG capsule Take 60-120 mg by mouth 2 (two) times daily with a meal. Take 1 capsule in the morning and 2 capsules in the evening       No current facility-administered medications for this visit.    Past Medical History  Diagnosis Date  . Bipolar 1 disorder   . Multiple personality   . Diabetes mellitus   . Atrial fib/flutter, transient   . Myocardial infarct   .  Pacemaker 06/02/2013  . DM2 (diabetes mellitus, type 2) 06/02/2013  . Neurocardiogenic syncope 06/02/2013  . Normal coronary arteries 06/02/2013  . Obesity (BMI 30.0-34.9) 06/02/2013  . Hypertriglyceridemia 06/02/2013    Past Surgical History  Procedure Laterality Date  . Pacemaker insertion    . Pacemaker insertion Left 2004  . Pacemaker generator change  March 2014    No family history on file.  History   Social History  . Marital Status: Divorced    Spouse Name: N/A    Number of Children: N/A  . Years of Education: N/A   Occupational History  . Not on file.   Social History Main Topics  . Smoking status: Current Every Day Smoker  . Smokeless tobacco: Not on file  . Alcohol Use: No  . Drug Use: No  . Sexual Activity: Not on file   Other Topics Concern  . Not on file   Social History Narrative  . No narrative on file    Review of systems: The patient specifically denies any chest pain at rest or with exertion, dyspnea at rest or with exertion, orthopnea, paroxysmal nocturnal dyspnea, syncope, palpitations, focal neurological deficits, intermittent claudication, lower extremity edema, unexplained weight gain, cough, hemoptysis or wheezing.  The patient also denies abdominal pain, nausea, vomiting, dysphagia, diarrhea, constipation, polyuria, polydipsia, dysuria,  hematuria, frequency, urgency, abnormal bleeding or bruising, fever, chills, unexpected weight changes, mood swings, change in skin or hair texture, change in voice quality, auditory or visual problems, allergic reactions or rashes, new musculoskeletal complaints other than usual "aches and pains".   PHYSICAL EXAM BP 126/80  Pulse 93  Resp 20  Ht 5\' 10"  (1.778 m)  Wt 220 lb 8 oz (100.018 kg)  BMI 31.64 kg/m2  General: Alert, oriented x3, no distress Head: no evidence of trauma, PERRL, EOMI, no exophtalmos or lid lag, no myxedema, no xanthelasma; normal ears, nose and oropharynx Neck: normal jugular  venous pulsations and no hepatojugular reflux; brisk carotid pulses without delay and no carotid bruits Chest: clear to auscultation, no signs of consolidation by percussion or palpation, normal fremitus, symmetrical and full respiratory excursions; healthy left subclavian pacemaker Cardiovascular: normal position and quality of the apical impulse, regular rhythm, normal first and second heart sounds, no murmurs, rubs or gallops Abdomen: no tenderness or distention, no masses by palpation, no abnormal pulsatility or arterial bruits, normal bowel sounds, no hepatosplenomegaly Extremities: no clubbing, cyanosis or edema; 2+ radial, ulnar and brachial pulses bilaterally; 2+ right femoral, posterior tibial and dorsalis pedis pulses; 2+ left femoral, posterior tibial and dorsalis pedis pulses; no subclavian or femoral bruits Neurological: grossly nonfocal   EKG: Sinus rhythm, nonspecific generalized T-wave flattening, borderline QTC around 460 ms   Lipid Panel     Component Value Date/Time   CHOL 175 01/24/2013 1008   TRIG 397* 01/24/2013 1008   HDL 33* 01/24/2013 1008   CHOLHDL 5.3 01/24/2013 1008   VLDL 79* 01/24/2013 1008   LDLCALC 63 01/24/2013 1008    BMET    Component Value Date/Time   NA 130* 01/24/2013 1008   K 3.7 01/24/2013 1008   CL 97 01/24/2013 1008   CO2 26 01/24/2013 1008   GLUCOSE 394* 01/24/2013 1008   BUN 9 01/24/2013 1008   CREATININE 1.02 01/24/2013 1008   CREATININE 0.69 12/06/2008 0715   CALCIUM 8.8 01/24/2013 1008   GFRNONAA >60 12/06/2008 0715   GFRAA  Value: >60        The eGFR has been calculated using the MDRD equation. This calculation has not been validated in all clinical situations. eGFR's persistently <60 mL/min signify possible Chronic Kidney Disease. 12/06/2008 0715     ASSESSMENT AND PLAN Pacemaker - Medtronic adaptadual-chamber implanted 2004, generator change 2014 Full interrogation shows normal device function. Numerous episodes of sudden rate drop intervention  are recorded, but pacing occurs less than 0.5% of the time. No mode switches recorded. 1 high ventricular rates noted lasting 3 sec, with a V rate of 190bpm---?SVT. 239 rate drop episodes. Device programmed at appropriate safety margins. Histogram distribution appropriate for patient activity level. Device programmed to optimize intrinsic conduction. Estimated longevity 14.5 years  Neurocardiogenic syncope No recurrence since pacemaker implant  Normal coronary arteries By angiography 2004, normal nuclear stress test 2013 (diaphragmatic attenuation artifact)  DM2 (diabetes mellitus, type 2) Uncontrolled, hemoglobin A1c 8.6%  Hypertriglyceridemia Both his hyperglycemia and hypertriglyceridemia are probably worsened by use of neuroleptics  Prolonged QT interval This is also likely related to neuroleptics. Caution has to be taken to avoid other QT prolonging medications.   Patient Instructions  Your physician recommends that you schedule a follow-up appointment in: 3 month Pacer Check     Orders Placed This Encounter  Procedures  . Implantable device check  . EKG 12-Lead   Meds ordered this encounter  Medications  . ranitidine (  ZANTAC) 150 MG tablet    Sig: Take 150 mg by mouth 2 (two) times daily.    Junious Silk, MD, [facility] [phone] office [phone] pager

## 2013-06-02 NOTE — Assessment & Plan Note (Signed)
Both his hyperglycemia and hypertriglyceridemia are probably worsened by use of neuroleptics

## 2013-06-02 NOTE — Assessment & Plan Note (Signed)
Uncontrolled, hemoglobin A1c 8.6%

## 2013-06-02 NOTE — Assessment & Plan Note (Signed)
This is also likely related to neuroleptics. Caution has to be taken to avoid other QT prolonging medications.

## 2013-06-02 NOTE — Assessment & Plan Note (Signed)
By angiography 2004, normal nuclear stress test 2013 (diaphragmatic attenuation artifact)

## 2013-06-02 NOTE — Assessment & Plan Note (Addendum)
Full interrogation shows normal device function. Numerous episodes of sudden rate drop intervention are recorded, but pacing occurs less than 0.5% of the time. No mode switches recorded. 1 high ventricular rates noted lasting 3 sec, with a V rate of 190bpm---?SVT. 239 rate drop episodes. Device programmed at appropriate safety margins. Histogram distribution appropriate for patient activity level. Device programmed to optimize intrinsic conduction. Estimated longevity 14.5 years

## 2013-06-17 ENCOUNTER — Other Ambulatory Visit: Payer: Self-pay | Admitting: *Deleted

## 2013-06-17 MED ORDER — RANITIDINE HCL 150 MG PO TABS: 150.0000 mg | ORAL_TABLET | Freq: Two times a day (BID) | ORAL | Status: DC

## 2013-08-27 ENCOUNTER — Encounter: Payer: Self-pay | Admitting: *Deleted

## 2013-08-28 ENCOUNTER — Encounter: Payer: PRIVATE HEALTH INSURANCE | Admitting: Cardiovascular Disease

## 2013-09-06 ENCOUNTER — Encounter: Payer: PRIVATE HEALTH INSURANCE | Admitting: Cardiovascular Disease

## 2013-09-26 ENCOUNTER — Encounter: Payer: Self-pay | Admitting: Cardiovascular Disease

## 2013-09-26 ENCOUNTER — Ambulatory Visit (INDEPENDENT_AMBULATORY_CARE_PROVIDER_SITE_OTHER): Payer: PRIVATE HEALTH INSURANCE | Admitting: Cardiovascular Disease

## 2013-09-26 VITALS — BP 146/86 | HR 86 | Resp 16 | Ht 70.0 in | Wt 220.8 lb

## 2013-09-26 DIAGNOSIS — R209 Unspecified disturbances of skin sensation: Secondary | ICD-10-CM

## 2013-09-26 DIAGNOSIS — R200 Anesthesia of skin: Secondary | ICD-10-CM

## 2013-09-26 DIAGNOSIS — R9431 Abnormal electrocardiogram [ECG] [EKG]: Secondary | ICD-10-CM

## 2013-09-26 DIAGNOSIS — R55 Syncope and collapse: Secondary | ICD-10-CM

## 2013-09-26 DIAGNOSIS — Z950 Presence of cardiac pacemaker: Secondary | ICD-10-CM

## 2013-09-26 NOTE — Patient Instructions (Addendum)
Remote monitoring is used to monitor your pacemaker from home. This monitoring reduces the number of office visits required to check your device to one time per year. It allows us to keep an eye on the functioning of your device to ensure it is working properly. You are scheduled for a device check from home on 12-30-2013. You may send your transmission at any time that day. If you have a wireless device, the transmission will be sent automatically. After your physician reviews your transmission, you will receive a postcard with your next transmission date.  Your physician recommends that you schedule a follow-up appointment in: 12 months with Dr.Croitoru    

## 2013-09-29 ENCOUNTER — Encounter: Payer: Self-pay | Admitting: Cardiovascular Disease

## 2013-09-29 NOTE — Assessment & Plan Note (Signed)
No recurrence since pacemaker implantation

## 2013-09-29 NOTE — Assessment & Plan Note (Signed)
Device function is normal. No adjustments were made to the current settings.

## 2013-09-29 NOTE — Progress Notes (Signed)
Patient ID: Frank Powell, male   DOB: 12/02/1960, 52 y.o.   MRN: [id number]      Reason for office visit Pacemaker followup  Frank Powell had a pacemaker implanted in 2004 for neurocardiogenic syncope with a profound cardioinhibitory component. He has not had syncope since. The device intravenous frequently but the overall pacing burden is very low. He has only had one episode of dizziness that happened about 2 days ago and lasted for 5-10 minutes in the last 3 months. It was associated with some numbness of his left arm. He denies chest pain, dyspnea, edema or full blown syncope. His psychiatric medications have been adjusted and he is now taking Seroquel, Geodon as well as Trazodone and Neurontin. His QT interval has increased substantially. He has normal left ventricular systolic function and normal coronary arteries by previous angiography.   Allergies  Allergen Reactions  . Shellfish Allergy Nausea And Vomiting    Current Outpatient Prescriptions  Medication Sig Dispense Refill  . ALPRAZolam (XANAX) 1 MG tablet Take 1 mg by mouth 4 (four) times daily.       . fenofibrate (TRICOR) 48 MG tablet Take 48 mg by mouth at bedtime.      . Fluticasone-Salmeterol (ADVAIR) 250-50 MCG/DOSE AEPB Inhale 1 puff into the lungs every 12 (twelve) hours.      . gabapentin (NEURONTIN) 300 MG capsule Take 300-600 mg by mouth 3 (three) times daily. Takes 1 capsule in am and at lunch and 2 capsules at night      . QUEtiapine (SEROQUEL XR) 400 MG 24 hr tablet Take 400 mg by mouth at bedtime.      . ranitidine (ZANTAC) 150 MG tablet Take 1 tablet (150 mg total) by mouth 2 (two) times daily.  60 tablet  5  . traZODone (DESYREL) 150 MG tablet Take 300 mg by mouth at bedtime.      . ziprasidone (GEODON) 60 MG capsule Take 60-120 mg by mouth 2 (two) times daily with a meal. Take 1 capsule in the morning and 2 capsules in the evening       No current facility-administered medications for this visit.    Past  Medical History  Diagnosis Date  . Bipolar 1 disorder   . Multiple personality   . Diabetes mellitus   . Atrial fib/flutter, transient   . Myocardial infarct   . Pacemaker 06/02/2013  . DM2 (diabetes mellitus, type 2) 06/02/2013  . Neurocardiogenic syncope 06/02/2013  . Normal coronary arteries 06/02/2013  . Obesity (BMI 30.0-34.9) 06/02/2013  . Hypertriglyceridemia 06/02/2013    Past Surgical History  Procedure Laterality Date  . Pacemaker insertion Left 02/03/2003    Medtronic  . Pacemaker generator change  March 2014    Medtronic  . Nm myocar perf wall motion  10/06/2011    No ischemia  . Cardiac catheterization  09/26/2002    normal coronary arteries and LV    No family history on file.  History   Social History  . Marital Status: Divorced    Spouse Name: N/A    Number of Children: N/A  . Years of Education: N/A   Occupational History  . Not on file.   Social History Main Topics  . Smoking status: Current Every Day Smoker  . Smokeless tobacco: Not on file  . Alcohol Use: No  . Drug Use: No  . Sexual Activity: Not on file   Other Topics Concern  . Not on file   Social History Narrative  .   No narrative on file    Review of systems: The patient specifically denies any chest pain at rest or with exertion, dyspnea at rest or with exertion, orthopnea, paroxysmal nocturnal dyspnea, syncope, palpitations, focal neurological deficits, intermittent claudication, lower extremity edema, unexplained weight gain, cough, hemoptysis or wheezing.  The patient also denies abdominal pain, nausea, vomiting, dysphagia, diarrhea, constipation, polyuria, polydipsia, dysuria, hematuria, frequency, urgency, abnormal bleeding or bruising, fever, chills, unexpected weight changes, mood swings, change in skin or hair texture, change in voice quality, auditory or visual problems, allergic reactions or rashes, new musculoskeletal complaints other than usual "aches and  pains".   PHYSICAL EXAM BP 146/86  Pulse 86  Resp 16  Ht 5' 10" (1.778 m)  Wt 100.154 kg (220 lb 12.8 oz)  BMI 31.68 kg/m2  General: Alert, oriented x3, no distress Head: no evidence of trauma, PERRL, EOMI, no exophtalmos or lid lag, no myxedema, no xanthelasma; normal ears, nose and oropharynx Neck: normal jugular venous pulsations and no hepatojugular reflux; brisk carotid pulses without delay and no carotid bruits Chest: clear to auscultation, no signs of consolidation by percussion or palpation, normal fremitus, symmetrical and full respiratory excursions, healthy subclavian pacemaker site Cardiovascular: normal position and quality of the apical impulse, regular rhythm, normal first and second heart sounds, no murmurs, rubs or gallops Abdomen: no tenderness or distention, no masses by palpation, no abnormal pulsatility or arterial bruits, normal bowel sounds, no hepatosplenomegaly Extremities: no clubbing, cyanosis or edema; 2+ radial, ulnar and brachial pulses bilaterally; 2+ right femoral, posterior tibial and dorsalis pedis pulses; 2+ left femoral, posterior tibial and dorsalis pedis pulses; no subclavian or femoral bruits Neurological: grossly nonfocal   EKG: Sinus rhythm, QT interval approaching 500 ms  Lipid Panel     Component Value Date/Time   CHOL 175 01/24/2013 1008   TRIG 397* 01/24/2013 1008   HDL 33* 01/24/2013 1008   CHOLHDL 5.3 01/24/2013 1008   VLDL 79* 01/24/2013 1008   LDLCALC 63 01/24/2013 1008    BMET    Component Value Date/Time   NA 130* 01/24/2013 1008   K 3.7 01/24/2013 1008   CL 97 01/24/2013 1008   CO2 26 01/24/2013 1008   GLUCOSE 394* 01/24/2013 1008   BUN 9 01/24/2013 1008   CREATININE 1.02 01/24/2013 1008   CREATININE 0.69 12/06/2008 0715   CALCIUM 8.8 01/24/2013 1008   GFRNONAA >60 12/06/2008 0715   GFRAA  Value: >60        The eGFR has been calculated using the MDRD equation. This calculation has not been validated in all clinical situations. eGFR's  persistently <60 mL/min signify possible Chronic Kidney Disease. 12/06/2008 0715     ASSESSMENT AND PLAN No problem-specific assessment & plan notes found for this encounter.  Orders Placed This Encounter  Procedures  . EKG 12-Lead   No orders of the defined types were placed in this encounter.    ,   , MD, FACC [facility] [phone] office [phone] pager   

## 2013-09-29 NOTE — Assessment & Plan Note (Signed)
Needs periodic monitoring of his QT interval especially if his psychotropic medications are further adjusted. Avoid additional QT prolonging medications such as macrolide or quinolone antibiotics. Avoid diuretics.

## 2014-02-11 ENCOUNTER — Encounter: Payer: Self-pay | Admitting: Cardiovascular Disease

## 2014-06-26 ENCOUNTER — Encounter (HOSPITAL_COMMUNITY): Payer: Self-pay | Admitting: Cardiovascular Disease

## 2014-07-19 DIAGNOSIS — F4481 Dissociative identity disorder: Secondary | ICD-10-CM | POA: Diagnosis not present

## 2014-07-19 DIAGNOSIS — R05 Cough: Secondary | ICD-10-CM | POA: Diagnosis not present

## 2014-07-19 DIAGNOSIS — F319 Bipolar disorder, unspecified: Secondary | ICD-10-CM | POA: Diagnosis not present

## 2014-07-19 DIAGNOSIS — J029 Acute pharyngitis, unspecified: Secondary | ICD-10-CM | POA: Diagnosis not present

## 2014-07-19 DIAGNOSIS — Z950 Presence of cardiac pacemaker: Secondary | ICD-10-CM | POA: Diagnosis not present

## 2014-07-19 DIAGNOSIS — J440 Chronic obstructive pulmonary disease with acute lower respiratory infection: Secondary | ICD-10-CM | POA: Diagnosis not present

## 2014-07-19 DIAGNOSIS — E109 Type 1 diabetes mellitus without complications: Secondary | ICD-10-CM | POA: Diagnosis not present

## 2014-07-19 DIAGNOSIS — F419 Anxiety disorder, unspecified: Secondary | ICD-10-CM | POA: Diagnosis not present

## 2014-07-19 DIAGNOSIS — I252 Old myocardial infarction: Secondary | ICD-10-CM | POA: Diagnosis not present

## 2014-07-19 DIAGNOSIS — F17210 Nicotine dependence, cigarettes, uncomplicated: Secondary | ICD-10-CM | POA: Diagnosis not present

## 2014-07-19 DIAGNOSIS — J209 Acute bronchitis, unspecified: Secondary | ICD-10-CM | POA: Diagnosis not present

## 2014-07-19 DIAGNOSIS — Z79899 Other long term (current) drug therapy: Secondary | ICD-10-CM | POA: Diagnosis not present

## 2014-07-19 DIAGNOSIS — Z91013 Allergy to seafood: Secondary | ICD-10-CM | POA: Diagnosis not present

## 2014-07-26 DIAGNOSIS — Z720 Tobacco use: Secondary | ICD-10-CM | POA: Diagnosis not present

## 2014-07-26 DIAGNOSIS — E109 Type 1 diabetes mellitus without complications: Secondary | ICD-10-CM | POA: Diagnosis not present

## 2014-07-26 DIAGNOSIS — F419 Anxiety disorder, unspecified: Secondary | ICD-10-CM | POA: Diagnosis not present

## 2014-07-26 DIAGNOSIS — I252 Old myocardial infarction: Secondary | ICD-10-CM | POA: Diagnosis not present

## 2014-07-26 DIAGNOSIS — Z79899 Other long term (current) drug therapy: Secondary | ICD-10-CM | POA: Diagnosis not present

## 2014-07-26 DIAGNOSIS — Z950 Presence of cardiac pacemaker: Secondary | ICD-10-CM | POA: Diagnosis not present

## 2014-07-26 DIAGNOSIS — J029 Acute pharyngitis, unspecified: Secondary | ICD-10-CM | POA: Diagnosis not present

## 2014-07-26 DIAGNOSIS — F4481 Dissociative identity disorder: Secondary | ICD-10-CM | POA: Diagnosis not present

## 2014-07-26 DIAGNOSIS — F319 Bipolar disorder, unspecified: Secondary | ICD-10-CM | POA: Diagnosis not present

## 2014-07-26 DIAGNOSIS — J449 Chronic obstructive pulmonary disease, unspecified: Secondary | ICD-10-CM | POA: Diagnosis not present

## 2014-07-26 DIAGNOSIS — K219 Gastro-esophageal reflux disease without esophagitis: Secondary | ICD-10-CM | POA: Diagnosis not present

## 2014-12-25 ENCOUNTER — Ambulatory Visit (INDEPENDENT_AMBULATORY_CARE_PROVIDER_SITE_OTHER): Payer: Medicaid Other | Admitting: Otolaryngology

## 2015-01-06 ENCOUNTER — Encounter: Payer: Medicaid Other | Admitting: Cardiovascular Disease

## 2015-03-03 ENCOUNTER — Encounter: Payer: Medicaid Other | Admitting: Cardiovascular Disease

## 2015-03-21 ENCOUNTER — Encounter (HOSPITAL_COMMUNITY)
Admission: EM | Discharge status: Against Medical Advice | Payer: Medicaid Other | Source: Other Acute Inpatient Hospital | Attending: Cardiovascular Disease

## 2015-03-21 ENCOUNTER — Inpatient Hospital Stay (HOSPITAL_COMMUNITY)
Admission: EM | Admit: 2015-03-21 | Discharge: 2015-03-23 | DRG: 247 | Discharge status: Against Medical Advice | Payer: Medicare Other | Source: Other Acute Inpatient Hospital | Attending: Cardiovascular Disease | Admitting: Cardiovascular Disease

## 2015-03-21 ENCOUNTER — Encounter (HOSPITAL_COMMUNITY)
Admission: EM | Discharge status: Against Medical Advice | Payer: Self-pay | Source: Other Acute Inpatient Hospital | Attending: Cardiovascular Disease

## 2015-03-21 ENCOUNTER — Encounter (HOSPITAL_COMMUNITY): Payer: Self-pay | Admitting: Emergency Medicine

## 2015-03-21 DIAGNOSIS — I252 Old myocardial infarction: Secondary | ICD-10-CM

## 2015-03-21 DIAGNOSIS — I2102 ST elevation (STEMI) myocardial infarction involving left anterior descending coronary artery: Principal | ICD-10-CM | POA: Diagnosis present

## 2015-03-21 DIAGNOSIS — I2510 Atherosclerotic heart disease of native coronary artery without angina pectoris: Secondary | ICD-10-CM | POA: Diagnosis present

## 2015-03-21 DIAGNOSIS — J449 Chronic obstructive pulmonary disease, unspecified: Secondary | ICD-10-CM | POA: Diagnosis present

## 2015-03-21 DIAGNOSIS — Z950 Presence of cardiac pacemaker: Secondary | ICD-10-CM | POA: Diagnosis not present

## 2015-03-21 DIAGNOSIS — Z79899 Other long term (current) drug therapy: Secondary | ICD-10-CM

## 2015-03-21 DIAGNOSIS — Z91199 Patient's noncompliance with other medical treatment and regimen due to unspecified reason: Secondary | ICD-10-CM

## 2015-03-21 DIAGNOSIS — I10 Essential (primary) hypertension: Secondary | ICD-10-CM | POA: Diagnosis present

## 2015-03-21 DIAGNOSIS — I213 ST elevation (STEMI) myocardial infarction of unspecified site: Secondary | ICD-10-CM | POA: Diagnosis present

## 2015-03-21 DIAGNOSIS — Z9119 Patient's noncompliance with other medical treatment and regimen: Secondary | ICD-10-CM | POA: Diagnosis present

## 2015-03-21 DIAGNOSIS — E119 Type 2 diabetes mellitus without complications: Secondary | ICD-10-CM | POA: Diagnosis present

## 2015-03-21 DIAGNOSIS — E785 Hyperlipidemia, unspecified: Secondary | ICD-10-CM | POA: Diagnosis present

## 2015-03-21 DIAGNOSIS — F17210 Nicotine dependence, cigarettes, uncomplicated: Secondary | ICD-10-CM | POA: Diagnosis present

## 2015-03-21 DIAGNOSIS — Z91013 Allergy to seafood: Secondary | ICD-10-CM

## 2015-03-21 DIAGNOSIS — E1159 Type 2 diabetes mellitus with other circulatory complications: Secondary | ICD-10-CM | POA: Diagnosis not present

## 2015-03-21 DIAGNOSIS — F319 Bipolar disorder, unspecified: Secondary | ICD-10-CM | POA: Diagnosis present

## 2015-03-21 DIAGNOSIS — I255 Ischemic cardiomyopathy: Secondary | ICD-10-CM | POA: Diagnosis present

## 2015-03-21 DIAGNOSIS — I219 Acute myocardial infarction, unspecified: Secondary | ICD-10-CM

## 2015-03-21 DIAGNOSIS — Z9861 Coronary angioplasty status: Secondary | ICD-10-CM

## 2015-03-21 HISTORY — PX: CARDIAC CATHETERIZATION: SHX172

## 2015-03-21 HISTORY — DX: Acute myocardial infarction, unspecified: I21.9

## 2015-03-21 LAB — CBC
HCT: 38.8 % — ABNORMAL LOW (ref 39.0–52.0)
Hemoglobin: 13.9 g/dL (ref 13.0–17.0)
MCH: 31.4 pg (ref 26.0–34.0)
MCHC: 35.8 g/dL (ref 30.0–36.0)
MCV: 87.6 fL (ref 78.0–100.0)
Platelets: 257 10*3/uL (ref 150–400)
RBC: 4.43 MIL/uL (ref 4.22–5.81)
RDW: 12.8 % (ref 11.5–15.5)
WBC: 14.2 10*3/uL — ABNORMAL HIGH (ref 4.0–10.5)

## 2015-03-21 LAB — BASIC METABOLIC PANEL
Anion gap: 8 (ref 5–15)
BUN: 7 mg/dL (ref 6–20)
CO2: 26 mmol/L (ref 22–32)
Calcium: 8.3 mg/dL — ABNORMAL LOW (ref 8.9–10.3)
Chloride: 94 mmol/L — ABNORMAL LOW (ref 101–111)
Creatinine, Ser: 0.91 mg/dL (ref 0.61–1.24)
GFR calc Af Amer: >60 mL/min (ref >60)
GFR calc non Af Amer: >60 mL/min (ref >60)
Glucose, Bld: 422 mg/dL — ABNORMAL HIGH (ref 65–99)
Potassium: 3.7 mmol/L (ref 3.5–5.1)
Sodium: 128 mmol/L — ABNORMAL LOW (ref 135–145)

## 2015-03-21 LAB — DIFFERENTIAL
Basophils Absolute: 0 10*3/uL (ref 0.0–0.1)
Basophils Relative: 0 % (ref 0–1)
Eosinophils Absolute: 0.2 10*3/uL (ref 0.0–0.7)
Eosinophils Relative: 1 % (ref 0–5)
Lymphocytes Relative: 18 % (ref 12–46)
Lymphs Abs: 2.5 10*3/uL (ref 0.7–4.0)
Monocytes Absolute: 0.9 10*3/uL (ref 0.1–1.0)
Monocytes Relative: 7 % (ref 3–12)
Neutro Abs: 10.5 10*3/uL — ABNORMAL HIGH (ref 1.7–7.7)
Neutrophils Relative %: 74 % (ref 43–77)

## 2015-03-21 LAB — TROPONIN I
Troponin I: 0.03 ng/mL (ref <0.031)
Troponin I: 9.25 ng/mL (ref <0.031)
Troponin I: 21.71 ng/mL (ref <0.031)

## 2015-03-21 LAB — GLUCOSE, CAPILLARY
Glucose-Capillary: 357 mg/dL — ABNORMAL HIGH (ref 65–99)
Glucose-Capillary: 237 mg/dL — ABNORMAL HIGH (ref 65–99)

## 2015-03-21 LAB — MRSA PCR SCREENING: MRSA by PCR: NEGATIVE

## 2015-03-21 LAB — I-STAT TROPONIN, ED: Troponin i, poc: 0.04 ng/mL (ref 0.00–0.08)

## 2015-03-21 LAB — PROTIME-INR
INR: 0.99 (ref 0.00–1.49)
Prothrombin Time: 13.3 seconds (ref 11.6–15.2)

## 2015-03-21 LAB — APTT: aPTT: 26 seconds (ref 24–37)

## 2015-03-21 LAB — TSH: TSH: 1.67 u[IU]/mL (ref 0.350–4.500)

## 2015-03-21 SURGERY — Surgical Case
Anesthesia: *Unknown

## 2015-03-21 SURGERY — LEFT HEART CATH AND CORONARY ANGIOGRAPHY

## 2015-03-21 SURGERY — INVASIVE LAB ABORTED CASE

## 2015-03-21 MED ORDER — ASPIRIN EC 81 MG PO TBEC: 81.0000 mg | DELAYED_RELEASE_TABLET | Freq: Every day | ORAL | Status: DC

## 2015-03-21 MED ORDER — ENOXAPARIN SODIUM 40 MG/0.4ML ~~LOC~~ SOLN
40.0000 mg | SUBCUTANEOUS | Status: DC
  Administered 2015-03-22 – 2015-03-23 (×2): 40 mg via SUBCUTANEOUS
  Filled 2015-03-21 (×2): qty 0.4

## 2015-03-21 MED ORDER — IOHEXOL 350 MG/ML SOLN
INTRAVENOUS | Status: DC | PRN
  Administered 2015-03-21: 180 mL via INTRAVENOUS

## 2015-03-21 MED ORDER — VERAPAMIL HCL 2.5 MG/ML IV SOLN
INTRAVENOUS | Status: AC
  Filled 2015-03-21: qty 2

## 2015-03-21 MED ORDER — SODIUM CHLORIDE 0.9 % IV SOLN: 250.0000 mL | INTRAVENOUS | Status: DC | PRN

## 2015-03-21 MED ORDER — SODIUM CHLORIDE 0.9 % IV SOLN
1.7500 mg/kg/h | INTRAVENOUS | Status: AC
  Administered 2015-03-21: 1.75 mg/kg/h via INTRAVENOUS
  Filled 2015-03-21 (×2): qty 250

## 2015-03-21 MED ORDER — BIVALIRUDIN BOLUS VIA INFUSION - CUPID
INTRAVENOUS | Status: DC | PRN
  Administered 2015-03-21: 65.625 mg via INTRAVENOUS

## 2015-03-21 MED ORDER — NITROGLYCERIN 0.4 MG SL SUBL: 0.4000 mg | SUBLINGUAL_TABLET | SUBLINGUAL | Status: DC | PRN

## 2015-03-21 MED ORDER — ONDANSETRON HCL 4 MG/2ML IJ SOLN: 4.0000 mg | Freq: Four times a day (QID) | INTRAMUSCULAR | Status: DC | PRN

## 2015-03-21 MED ORDER — SODIUM CHLORIDE 0.9 % IJ SOLN: 3.0000 mL | INTRAMUSCULAR | Status: DC | PRN

## 2015-03-21 MED ORDER — TIOTROPIUM BROMIDE MONOHYDRATE 18 MCG IN CAPS
18.0000 ug | ORAL_CAPSULE | Freq: Every day | RESPIRATORY_TRACT | Status: DC
  Administered 2015-03-22: 18 ug via RESPIRATORY_TRACT
  Filled 2015-03-21 (×2): qty 5

## 2015-03-21 MED ORDER — FENTANYL CITRATE (PF) 100 MCG/2ML IJ SOLN
INTRAMUSCULAR | Status: DC | PRN
  Administered 2015-03-21: 25 ug via INTRAVENOUS

## 2015-03-21 MED ORDER — BIVALIRUDIN 250 MG IV SOLR
INTRAVENOUS | Status: AC
  Filled 2015-03-21: qty 250

## 2015-03-21 MED ORDER — ACETAMINOPHEN 325 MG PO TABS: 650.0000 mg | ORAL_TABLET | ORAL | Status: DC | PRN

## 2015-03-21 MED ORDER — GABAPENTIN 600 MG PO TABS
600.0000 mg | ORAL_TABLET | Freq: Every day | ORAL | Status: DC
  Administered 2015-03-21 – 2015-03-22 (×2): 600 mg via ORAL
  Filled 2015-03-21 (×2): qty 1

## 2015-03-21 MED ORDER — SODIUM CHLORIDE 0.9 % IJ SOLN
3.0000 mL | Freq: Two times a day (BID) | INTRAMUSCULAR | Status: DC
  Administered 2015-03-22: 3 mL via INTRAVENOUS
  Administered 2015-03-22: 21:00:00 via INTRAVENOUS
  Administered 2015-03-23: 3 mL via INTRAVENOUS

## 2015-03-21 MED ORDER — CETYLPYRIDINIUM CHLORIDE 0.05 % MT LIQD
7.0000 mL | Freq: Two times a day (BID) | OROMUCOSAL | Status: DC
  Administered 2015-03-21 – 2015-03-23 (×4): 7 mL via OROMUCOSAL

## 2015-03-21 MED ORDER — ALPRAZOLAM 0.5 MG PO TABS: 2.0000 mg | ORAL_TABLET | Freq: Every evening | ORAL | Status: DC | PRN

## 2015-03-21 MED ORDER — SODIUM CHLORIDE 0.9 % IJ SOLN
3.0000 mL | Freq: Two times a day (BID) | INTRAMUSCULAR | Status: DC
  Administered 2015-03-21 – 2015-03-23 (×2): 3 mL via INTRAVENOUS

## 2015-03-21 MED ORDER — INSULIN ASPART 100 UNIT/ML ~~LOC~~ SOLN
0.0000 [IU] | Freq: Three times a day (TID) | SUBCUTANEOUS | Status: DC
  Administered 2015-03-21: 15 [IU] via SUBCUTANEOUS
  Administered 2015-03-22 (×2): 11 [IU] via SUBCUTANEOUS
  Administered 2015-03-22: 15 [IU] via SUBCUTANEOUS
  Administered 2015-03-23: 5 [IU] via SUBCUTANEOUS

## 2015-03-21 MED ORDER — ASPIRIN EC 81 MG PO TBEC
81.0000 mg | DELAYED_RELEASE_TABLET | Freq: Every day | ORAL | Status: DC
  Administered 2015-03-22 – 2015-03-23 (×2): 81 mg via ORAL
  Filled 2015-03-21 (×2): qty 1

## 2015-03-21 MED ORDER — LIDOCAINE HCL (PF) 1 % IJ SOLN
INTRAMUSCULAR | Status: AC
  Filled 2015-03-21: qty 30

## 2015-03-21 MED ORDER — ATORVASTATIN CALCIUM 80 MG PO TABS
80.0000 mg | ORAL_TABLET | Freq: Every day | ORAL | Status: DC
  Administered 2015-03-21 – 2015-03-22 (×2): 80 mg via ORAL
  Filled 2015-03-21 (×2): qty 1

## 2015-03-21 MED ORDER — NITROGLYCERIN 1 MG/10 ML FOR IR/CATH LAB
INTRA_ARTERIAL | Status: AC
  Filled 2015-03-21: qty 10

## 2015-03-21 MED ORDER — TICAGRELOR 90 MG PO TABS
ORAL_TABLET | ORAL | Status: AC
  Filled 2015-03-21: qty 1

## 2015-03-21 MED ORDER — TICAGRELOR 90 MG PO TABS
ORAL_TABLET | ORAL | Status: DC | PRN
  Administered 2015-03-21: 180 mg via ORAL

## 2015-03-21 MED ORDER — ATORVASTATIN CALCIUM 80 MG PO TABS: 80.0000 mg | ORAL_TABLET | Freq: Every day | ORAL | Status: DC

## 2015-03-21 MED ORDER — NITROGLYCERIN IN D5W 200-5 MCG/ML-% IV SOLN: 0.0000 ug/min | INTRAVENOUS | Status: DC

## 2015-03-21 MED ORDER — ALPRAZOLAM 1 MG PO TABS: 2.0000 mg | ORAL_TABLET | Freq: Every day | ORAL | Status: DC

## 2015-03-21 MED ORDER — ALPRAZOLAM 0.5 MG PO TABS
1.0000 mg | ORAL_TABLET | Freq: Four times a day (QID) | ORAL | Status: DC | PRN
  Administered 2015-03-21: 1 mg via ORAL
  Filled 2015-03-21: qty 2

## 2015-03-21 MED ORDER — NITROGLYCERIN 1 MG/10 ML FOR IR/CATH LAB
INTRA_ARTERIAL | Status: DC | PRN
  Administered 2015-03-21: 15:00:00

## 2015-03-21 MED ORDER — GABAPENTIN 300 MG PO CAPS: 300.0000 mg | ORAL_CAPSULE | Freq: Three times a day (TID) | ORAL | Status: DC

## 2015-03-21 MED ORDER — ALBUTEROL SULFATE HFA 108 (90 BASE) MCG/ACT IN AERS: 1.0000 | INHALATION_SPRAY | Freq: Four times a day (QID) | RESPIRATORY_TRACT | Status: DC | PRN

## 2015-03-21 MED ORDER — ZIPRASIDONE HCL 60 MG PO CAPS
60.0000 mg | ORAL_CAPSULE | Freq: Two times a day (BID) | ORAL | Status: DC
  Filled 2015-03-21 (×2): qty 1

## 2015-03-21 MED ORDER — ZOLPIDEM TARTRATE 5 MG PO TABS: 5.0000 mg | ORAL_TABLET | Freq: Every evening | ORAL | Status: DC | PRN

## 2015-03-21 MED ORDER — QUETIAPINE FUMARATE ER 300 MG PO TB24
600.0000 mg | ORAL_TABLET | Freq: Every day | ORAL | Status: DC
  Administered 2015-03-21 – 2015-03-22 (×2): 600 mg via ORAL
  Filled 2015-03-21 (×4): qty 2

## 2015-03-21 MED ORDER — HEPARIN (PORCINE) IN NACL 2-0.9 UNIT/ML-% IJ SOLN
INTRAMUSCULAR | Status: AC
  Filled 2015-03-21: qty 1000

## 2015-03-21 MED ORDER — BENAZEPRIL HCL 5 MG PO TABS
5.0000 mg | ORAL_TABLET | Freq: Every day | ORAL | Status: DC
  Administered 2015-03-22: 5 mg via ORAL
  Filled 2015-03-21 (×3): qty 1

## 2015-03-21 MED ORDER — MIDAZOLAM HCL 2 MG/2ML IJ SOLN
INTRAMUSCULAR | Status: DC | PRN
  Administered 2015-03-21: 1 mg via INTRAVENOUS

## 2015-03-21 MED ORDER — INSULIN ASPART 100 UNIT/ML ~~LOC~~ SOLN
0.0000 [IU] | Freq: Every day | SUBCUTANEOUS | Status: DC
  Administered 2015-03-21: 2 [IU] via SUBCUTANEOUS
  Administered 2015-03-22: 4 [IU] via SUBCUTANEOUS

## 2015-03-21 MED ORDER — ALBUTEROL SULFATE (2.5 MG/3ML) 0.083% IN NEBU: 2.5000 mg | INHALATION_SOLUTION | Freq: Four times a day (QID) | RESPIRATORY_TRACT | Status: DC | PRN

## 2015-03-21 MED ORDER — ZIPRASIDONE HCL 60 MG PO CAPS
60.0000 mg | ORAL_CAPSULE | Freq: Two times a day (BID) | ORAL | Status: DC
  Administered 2015-03-21 – 2015-03-23 (×4): 60 mg via ORAL
  Filled 2015-03-21 (×7): qty 1

## 2015-03-21 MED ORDER — TRAZODONE HCL 150 MG PO TABS
150.0000 mg | ORAL_TABLET | Freq: Every evening | ORAL | Status: DC | PRN
  Administered 2015-03-21 – 2015-03-22 (×2): 150 mg via ORAL
  Filled 2015-03-21 (×4): qty 2

## 2015-03-21 MED ORDER — HYDROCODONE-ACETAMINOPHEN 7.5-325 MG PO TABS
1.0000 | ORAL_TABLET | Freq: Three times a day (TID) | ORAL | Status: DC | PRN
  Administered 2015-03-21: 1 via ORAL
  Filled 2015-03-21: qty 1

## 2015-03-21 MED ORDER — GEMFIBROZIL 600 MG PO TABS
600.0000 mg | ORAL_TABLET | Freq: Two times a day (BID) | ORAL | Status: DC
  Filled 2015-03-21 (×4): qty 1

## 2015-03-21 MED ORDER — TICAGRELOR 90 MG PO TABS
90.0000 mg | ORAL_TABLET | Freq: Two times a day (BID) | ORAL | Status: DC
Start: 2015-03-22 — End: 2015-03-23
  Administered 2015-03-22 – 2015-03-23 (×3): 90 mg via ORAL
  Filled 2015-03-21 (×4): qty 1

## 2015-03-21 MED ORDER — SODIUM CHLORIDE 0.9 % IV SOLN
250.0000 mg | INTRAVENOUS | Status: DC | PRN
  Administered 2015-03-21: 1.75 mg/kg/h via INTRAVENOUS

## 2015-03-21 MED ORDER — SODIUM CHLORIDE 0.9 % IV SOLN
INTRAVENOUS | Status: AC
  Administered 2015-03-22: 01:00:00 via INTRAVENOUS

## 2015-03-21 MED ORDER — FENTANYL CITRATE (PF) 100 MCG/2ML IJ SOLN
INTRAMUSCULAR | Status: AC
  Filled 2015-03-21: qty 4

## 2015-03-21 MED ORDER — SODIUM CHLORIDE 0.9 % IV SOLN
INTRAVENOUS | Status: AC
  Administered 2015-03-21: 16:00:00 via INTRAVENOUS

## 2015-03-21 MED ORDER — MIDAZOLAM HCL 2 MG/2ML IJ SOLN
INTRAMUSCULAR | Status: AC
  Filled 2015-03-21: qty 4

## 2015-03-21 MED ORDER — GABAPENTIN 300 MG PO CAPS
300.0000 mg | ORAL_CAPSULE | Freq: Two times a day (BID) | ORAL | Status: DC
  Administered 2015-03-21 – 2015-03-23 (×4): 300 mg via ORAL
  Filled 2015-03-21 (×4): qty 1

## 2015-03-21 SURGICAL SUPPLY — 16 items
BALLN EMERGE MR 2.5X12 (BALLOONS) ×3
BALLN ~~LOC~~ TREK RX 3.75X20 (BALLOONS) ×2 IMPLANT
BALLOON EMERGE MR 2.5X12 (BALLOONS) IMPLANT
CATH INFINITI 5FR ANG PIGTAIL (CATHETERS) ×2 IMPLANT
CATH OPTITORQUE TIG 4.0 5F (CATHETERS) ×2 IMPLANT
CATH VISTA GUIDE 6FR XBLAD3.5 (CATHETERS) ×2 IMPLANT
DEVICE RAD COMP TR BAND LRG (VASCULAR PRODUCTS) ×2 IMPLANT
GLIDESHEATH SLEND A-KIT 6F 22G (SHEATH) ×2 IMPLANT
GUIDE CATH RUNWAY 6FR CLS3.5 (CATHETERS) ×2 IMPLANT
KIT ENCORE 26 ADVANTAGE (KITS) ×2 IMPLANT
KIT HEART LEFT (KITS) ×2 IMPLANT
PACK CARDIAC CATHETERIZATION (CUSTOM PROCEDURE TRAY) ×2 IMPLANT
STENT XIENCE ALPINE RX 3.5X38 (Permanent Stent) ×2 IMPLANT
TUBING CIL FLEX 10 FLL-RA (TUBING) ×2 IMPLANT
WIRE ASAHI MEDIUM 180CM (WIRE) ×2 IMPLANT
WIRE SAFE-T 1.5MM-J .035X260CM (WIRE) ×2 IMPLANT

## 2015-03-21 NOTE — H&P (Signed)
History and Physical   Patient ID: Frank Powell MRN: [id number], DOB/AGE: [birth year] 54 y.o. Date of Encounter: 03/21/2015  Primary Physician: Juliette Alcide, MD Primary Cardiologist: Dr Royann Shivers - last O.V. 2014  Chief Complaint:  STEMI  HPI: Frank Powell is a 54 y.o. male with a history of syncope, MDT PPM insertion, normal cors, COPD, DM, Bipolar d/o w/ mult remote suicide attempts.   He was in his usual state of health today when he had onset of substernal chest pain. The pain radiated to his L arm. It was 10/10. He tried an inhaler with no relief. When his pain did not resolve, he called EMS.  His ECG was concerning for STEMI. He was given 324 mg ASA, SL NTG x 3 and 6 mg MSO4 by EMS. He came through the ER, but was taken up to the cath lab. After interventions, his pain was a 7/10.  Past Medical History  Diagnosis Date  . Bipolar 1 disorder   . Multiple personality   . Atrial fib/flutter, transient   . Myocardial infarct   . Pacemaker 06/02/2013  . DM2 (diabetes mellitus, type 2) 06/02/2013  . Neurocardiogenic syncope 06/02/2013  . Normal coronary arteries 06/02/2013  . Obesity (BMI 30.0-34.9) 06/02/2013  . Hypertriglyceridemia 06/02/2013    Surgical History:  Past Surgical History  Procedure Laterality Date  . Pacemaker insertion Left 02/03/2003    Medtronic  . Pacemaker generator change  March 2014    Medtronic  . Nm myocar perf wall motion  10/06/2011    No ischemia  . Cardiac catheterization  09/26/2002    normal coronary arteries and LV  . Permanent pacemaker generator change N/A 09/28/2012    Procedure: PERMANENT PACEMAKER GENERATOR CHANGE;  Surgeon: Thurmon Fair, MD;  Location: [facility] CATH LAB;  Service: Cardiovascular;  Laterality: N/A;     I have reviewed the patient's current medications. Prior to Admission medications   Medication Sig Start Date End Date Taking? Authorizing Provider  atorvastatin (LIPITOR) 40 MG tablet Take 40 mg by mouth daily.    Yes Historical Provider, MD  benazepril (LOTENSIN) 5 MG tablet Take 5 mg by mouth daily.   Yes Historical Provider, MD  gabapentin (NEURONTIN) 300 MG capsule Take 300-600 mg by mouth 3 (three) times daily. Takes 1 capsule in am and at lunch and 2 capsules at night   Yes Historical Provider, MD  gemfibrozil (LOPID) 600 MG tablet Take 600 mg by mouth 2 (two) times daily before a meal.   Yes Historical Provider, MD  glipiZIDE-metformin (METAGLIP) 5-500 MG per tablet Take 1 tablet by mouth 2 (two) times daily before a meal.   Yes Historical Provider, MD  tiotropium (SPIRIVA) 18 MCG inhalation capsule Place 18 mcg into inhaler and inhale daily.   Yes Historical Provider, MD  traZODone (DESYREL) 150 MG tablet Take 150-300 mg by mouth at bedtime as needed for sleep.    Yes Historical Provider, MD  ziprasidone (GEODON) 60 MG capsule Take 60 mg by mouth 2 (two) times daily with a meal.    Yes Historical Provider, MD  ALPRAZolam (XANAX) 1 MG tablet Take 1 mg by mouth 4 (four) times daily.     Historical Provider, MD  fenofibrate (TRICOR) 48 MG tablet Take 48 mg by mouth at bedtime.    Historical Provider, MD  Fluticasone-Salmeterol (ADVAIR) 250-50 MCG/DOSE AEPB Inhale 1 puff into the lungs every 12 (twelve) hours.    Historical Provider, MD  QUEtiapine (SEROQUEL XR)  400 MG 24 hr tablet Take 400 mg by mouth at bedtime.    Historical Provider, MD  ranitidine (ZANTAC) 150 MG tablet Take 1 tablet (150 mg total) by mouth 2 (two) times daily. 06/17/13   Runell Gess, MD   Allergies:  Allergies  Allergen Reactions  . Shellfish Allergy Nausea And Vomiting    Social History   Social History  . Marital Status: Divorced    Spouse Name: N/A  . Number of Children: N/A  . Years of Education: N/A   Occupational History  . Disabled    Social History Main Topics  . Smoking status: Current Every Day Smoker  . Smokeless tobacco: Not on file  . Alcohol Use: No  . Drug Use: No  . Sexual Activity: Not on  file   Other Topics Concern  . Not on file   Social History Narrative   Lives in [place], [place].    Family History  Problem Relation Age of Onset  . Coronary artery disease Father   . Diabetes Mother   . Diabetes Father    Review of Systems and further History not obtainable due to pt condition. Pt denies bleeding issues.  Physical Exam: Blood pressure 137/75, pulse 94, temperature 97.9 F (36.6 C), temperature source Oral, resp. rate 18, height 5\' 10"  (1.778 m), weight 193 lb (87.544 kg), SpO2 98 %. General: Well developed, well nourished,male in no acute distress. Head: Normocephalic, atraumatic, sclera non-icteric, no xanthomas, nares are without discharge. Dentition: poor Neck: No carotid bruits. JVD not elevated. No thyromegally Lungs: Good expansion bilaterally. without wheezes or rhonchi.  Heart: Regular rate and rhythm with S1 S2.  No S3 or S4.  No murmur, no rubs, or gallops appreciated. Abdomen: Soft, non-tender, non-distended with normoactive bowel sounds. No hepatomegaly. No rebound/guarding. No obvious abdominal masses. Msk:  Strength and tone appear normal for age. No joint deformities or effusions, no spine or costo-vertebral angle tenderness. Extremities: No clubbing or cyanosis. No edema.  Distal pedal pulses are 2+ in 4 extrem Neuro: Alert and oriented X 3. Moves all extremities spontaneously. No focal deficits noted. Psych:  Responds to questions appropriately with a normal affect. Skin: No rashes or lesions noted  Labs: BMET and PTT in process   Lab Results  Component Value Date   WBC 14.2* 03/21/2015   HGB 13.9 03/21/2015   HCT 38.8* 03/21/2015   MCV 87.6 03/21/2015   PLT 257 03/21/2015    Recent Labs  03/21/15 1404  INR 0.99    Recent Labs  03/21/15 1411  TROPIPOC 0.04    ECG: SR, Anterior ST elevation and reciprocal inferior changes  ASSESSMENT AND PLAN:  Principal Problem:   ST elevation (STEMI) myocardial infarction involving left anterior  descending coronary artery - cath with further evaluation and treatment depending on the results.  Continue home medications with SSI and screen for CRF control. Active Problems:   Pacemaker - Medtronic adaptadual-chamber implanted 2004, generator change 2014   DM2 (diabetes mellitus, type 2)  Signed, Theodore Demark, PA-C 03/21/2015 2:50 PM Beeper [phone]   Patient seen and examined. Agree with assessment and plan.  Mr. Pacific is a 54 year old male who has a history of bipolar disorder, reported remote history of prior myocardial infarction, COPD, and diabetes mellitus.  Has a history of syncope and is status post permanent pacemaker insertion.  The patient developed severe chest pain today and was brought to [facility] acutely by [facility] as a anterior wall code STEMI.  He has 10 out of 10 chest pain.  ECG reveals sinus rhythm at 85 bpm.  There is QS complex in V1 V2 with 1 mm ST elevation precordially.  Emergent cardiac catheterization with probable PCI was discussed with the patient who agrees to proceed.   Lennette Bihari, MD, [facility] 03/21/2015 4:13 PM

## 2015-03-21 NOTE — Progress Notes (Signed)
   03/21/15 1900  Clinical Encounter Type  Visited With Health care provider  Visit Type Code;ED  Referral From Nurse  Consult/Referral To Chaplain    Chaplain responsed to code stemi, there are no family at the bedside. Nurses will page Chaplain if needed.

## 2015-03-21 NOTE — Progress Notes (Signed)
CRITICAL VALUE ALERT  Critical value received:  Trip 9.25  Date of notification:  03/21/15  Time of notification:  1700  Critical value read back:Yes.    Nurse who received alert:  n Amelio Brosky rn  MD notified (1st page):  crenshaw  Time of first page:  1700  MD notified (2nd page):  Time of second page:  Responding MD:  Jens Som  Time MD responded:  [phone]

## 2015-03-21 NOTE — ED Notes (Addendum)
Pt from home via [facility] with c/o sudden onset substernal chest tightness with left arm numbness at approx noon today.  Pt given 324 mg aspirin, 6 mg morphine, and 3 sublingual nitro.  Hx MI 2001 and COPD, not on O2 at home, pacemaker in place.  Reports mild SOB, took home inhaler with no relief.  Pt reports pain decreased from 10/10 to 7/10.  Alert.

## 2015-03-21 NOTE — ED Notes (Signed)
Pt transported to cath lab by Susy Frizzle, RN

## 2015-03-21 NOTE — ED Provider Notes (Signed)
CSN: [id number]     Arrival date & time 03/21/15  1358 History   First MD Initiated Contact with Patient 03/21/15 1408     Chief Complaint  Patient presents with  . Code STEMI     (Consider location/radiation/quality/duration/timing/severity/associated sxs/prior Treatment) Patient is a 54 y.o. male presenting with chest pain. The history is provided by the patient.  Chest Pain Pain location:  Substernal area Pain quality: crushing and pressure   Pain radiates to:  L shoulder and L arm Pain radiates to the back: no   Pain severity:  Moderate Onset quality:  Sudden Duration:  2 hours Timing:  Constant Progression:  Worsening Chronicity:  Recurrent Relieved by:  Nothing Worsened by:  Nothing tried Ineffective treatments:  None tried Associated symptoms: diaphoresis, nausea and shortness of breath   Associated symptoms: no abdominal pain, no fever, no headache, no palpitations and not vomiting   Risk factors: coronary artery disease     Past Medical History  Diagnosis Date  . Bipolar 1 disorder   . Multiple personality   . Atrial fib/flutter, transient   . Myocardial infarct   . Pacemaker 06/02/2013  . DM2 (diabetes mellitus, type 2) 06/02/2013  . Neurocardiogenic syncope 06/02/2013  . Normal coronary arteries 06/02/2013  . Obesity (BMI 30.0-34.9) 06/02/2013  . Hypertriglyceridemia 06/02/2013   Past Surgical History  Procedure Laterality Date  . Pacemaker insertion Left 02/03/2003    Medtronic  . Pacemaker generator change  March 2014    Medtronic  . Nm myocar perf wall motion  10/06/2011    No ischemia  . Cardiac catheterization  09/26/2002    normal coronary arteries and LV  . Permanent pacemaker generator change N/A 09/28/2012    Procedure: PERMANENT PACEMAKER GENERATOR CHANGE;  Surgeon: Thurmon Fair, MD;  Location: [facility] CATH [facility];  Service: Cardiovascular;  Laterality: N/A;   Family History  Problem Relation Age of Onset  . Coronary artery disease Father   .  Diabetes Mother   . Diabetes Father    Social History  Substance Use Topics  . Smoking status: Current Every Day Smoker  . Smokeless tobacco: None  . Alcohol Use: No    Review of Systems  Constitutional: Positive for diaphoresis. Negative for fever and chills.  HENT: Negative for congestion and facial swelling.   Eyes: Negative for discharge and visual disturbance.  Respiratory: Positive for shortness of breath.   Cardiovascular: Positive for chest pain. Negative for palpitations.  Gastrointestinal: Positive for nausea. Negative for vomiting, abdominal pain and diarrhea.  Musculoskeletal: Negative for myalgias and arthralgias.  Skin: Negative for color change and rash.  Neurological: Negative for tremors, syncope and headaches.  Psychiatric/Behavioral: Negative for confusion and dysphoric mood.      Allergies  Shellfish allergy  Home Medications   Prior to Admission medications   Medication Sig Start Date End Date Taking? Authorizing Provider  albuterol (PROVENTIL HFA;VENTOLIN HFA) 108 (90 BASE) MCG/ACT inhaler Inhale 1-2 puffs into the lungs every 6 (six) hours as needed for wheezing or shortness of breath.   Yes Historical Provider, MD  atorvastatin (LIPITOR) 40 MG tablet Take 40 mg by mouth daily.   Yes Historical Provider, MD  benazepril (LOTENSIN) 5 MG tablet Take 5 mg by mouth daily.   Yes Historical Provider, MD  gabapentin (NEURONTIN) 300 MG capsule Take 300-600 mg by mouth 3 (three) times daily. Takes 1 capsule in am and at lunch and 2 capsules at night   Yes Historical Provider, MD  gemfibrozil (LOPID)  600 MG tablet Take 600 mg by mouth 2 (two) times daily before a meal.   Yes Historical Provider, MD  glipiZIDE-metformin (METAGLIP) 5-500 MG per tablet Take 1 tablet by mouth 2 (two) times daily before a meal.   Yes Historical Provider, MD  HYDROcodone-acetaminophen (NORCO) 7.5-325 MG per tablet Take 1 tablet by mouth 3 (three) times daily. 02/26/15  Yes Historical  Provider, MD  QUEtiapine (SEROQUEL XR) 300 MG 24 hr tablet Take 600 mg by mouth at bedtime.   Yes Historical Provider, MD  tiotropium (SPIRIVA) 18 MCG inhalation capsule Place 18 mcg into inhaler and inhale daily.   Yes Historical Provider, MD  traZODone (DESYREL) 150 MG tablet Take 150-300 mg by mouth at bedtime as needed for sleep.    Yes Historical Provider, MD  ziprasidone (GEODON) 60 MG capsule Take 60 mg by mouth 2 (two) times daily with a meal.    Yes Historical Provider, MD  ALPRAZolam (XANAX) 1 MG tablet Take 1 mg by mouth See admin instructions. Take 1 tablet 4 times daily and 2 tablets at bedtime as needed    Historical Provider, MD   BP 130/85 mmHg  Pulse 0  Temp(Src) 97.9 F (36.6 C) (Oral)  Resp 0  Ht  (1.778 m)  Wt 193 lb (87.544 kg)  BMI 27.69 kg/m2  SpO2 0% Physical Exam  Constitutional: He is oriented to person, place, and time. He appears well-developed and well-nourished.  HENT:  Head: Normocephalic and atraumatic.  Eyes: EOM are normal. Pupils are equal, round, and reactive to light.  Neck: Normal range of motion. Neck supple. No JVD present.  Cardiovascular: Normal rate and regular rhythm.  Exam reveals no gallop and no friction rub.   No murmur heard. Pulmonary/Chest: No respiratory distress. He has no wheezes. He exhibits no tenderness.  Abdominal: He exhibits no distension. There is no rebound and no guarding.  Musculoskeletal: Normal range of motion.  Neurological: He is alert and oriented to person, place, and time.  Skin: No rash noted. No pallor.  Psychiatric: He has a normal mood and affect. His behavior is normal.    ED Course  Procedures (including critical care time) Labs Review Labs Reviewed  CBC - Abnormal; Notable for the following:    WBC 14.2 (*)    HCT 38.8 (*)    All other components within normal limits  DIFFERENTIAL - Abnormal; Notable for the following:    Neutro Abs 10.5 (*)    All other components within normal limits   BASIC METABOLIC PANEL - Abnormal; Notable for the following:    Sodium 128 (*)    Chloride 94 (*)    Glucose, Bld 422 (*)    Calcium 8.3 (*)    All other components within normal limits  PROTIME-INR  APTT  TROPONIN I  TSH  TROPONIN I  TROPONIN I  TROPONIN I  HEMOGLOBIN A1C  I-STAT TROPOININ, ED    Imaging Review No results found. I have personally reviewed and evaluated these images and lab results as part of my medical decision-making.   EKG Interpretation   Date/Time:  Saturday March 21 2015 14:03:20 EDT Ventricular Rate:  85 PR Interval:  182 QRS Duration: 76 QT Interval:  358 QTC Calculation: 426 R Axis:   40 Text Interpretation:  Sinus rhythm Anteroseptal infarct, old Borderline T  abnormalities, inferior leads ST elevation consider anterior injury or  acute infarct ST elevation consider lateral injury or acute infarct  Confirmed by Romeo Zielinski MD, DANIEL (  [id number]) on 03/21/2015 3:48:44 PM      MDM   Final diagnoses:  ST elevation (STEMI) myocardial infarction involving left anterior descending coronary artery    54 yo M with a chief complaint of chest pain. This started abruptly about an hour ago. Patient was at rest crushing substernal pain radiating to his left arm associated with diaphoresis and nausea. Feels like similar MI but much worse. Code STEMI was initiated in the field. On arrival to the ED patient's pain had improved after 6 mg of morphine 324 of aspirin, and 3 sublingual nitros. Patient still having ongoing pain. Given heparin bolus in the ED. Taken urgently to the cath lab.  CRITICAL CARE Performed by: Rae Roam   Total critical care time: 10 min   Critical care time was exclusive of separately billable procedures and treating other patients.  Critical care was necessary to treat or prevent imminent or life-threatening deterioration.  Critical care was time spent personally by me on the following activities: development of treatment  plan with patient and/or surrogate as well as nursing, discussions with consultants, evaluation of patient's response to treatment, examination of patient, obtaining history from patient or surrogate, ordering and performing treatments and interventions, ordering and review of laboratory studies, ordering and review of radiographic studies, pulse oximetry and re-evaluation of patient's condition.   Melene Plan, DO 03/21/15 1549

## 2015-03-22 ENCOUNTER — Inpatient Hospital Stay (HOSPITAL_COMMUNITY): Payer: Medicare Other

## 2015-03-22 DIAGNOSIS — I2510 Atherosclerotic heart disease of native coronary artery without angina pectoris: Secondary | ICD-10-CM

## 2015-03-22 LAB — CBC
HCT: 42.3 % (ref 39.0–52.0)
Hemoglobin: 14.9 g/dL (ref 13.0–17.0)
MCH: 31.1 pg (ref 26.0–34.0)
MCHC: 35.2 g/dL (ref 30.0–36.0)
MCV: 88.3 fL (ref 78.0–100.0)
Platelets: 310 10*3/uL (ref 150–400)
RBC: 4.79 MIL/uL (ref 4.22–5.81)
RDW: 13 % (ref 11.5–15.5)
WBC: 18 10*3/uL — ABNORMAL HIGH (ref 4.0–10.5)

## 2015-03-22 LAB — COMPREHENSIVE METABOLIC PANEL
ALT: 25 U/L (ref 17–63)
AST: 98 U/L — ABNORMAL HIGH (ref 15–41)
Albumin: 3.2 g/dL — ABNORMAL LOW (ref 3.5–5.0)
Alkaline Phosphatase: 110 U/L (ref 38–126)
Anion gap: 8 (ref 5–15)
BUN: 6 mg/dL (ref 6–20)
CO2: 27 mmol/L (ref 22–32)
Calcium: 8.1 mg/dL — ABNORMAL LOW (ref 8.9–10.3)
Chloride: 97 mmol/L — ABNORMAL LOW (ref 101–111)
Creatinine, Ser: 0.74 mg/dL (ref 0.61–1.24)
GFR calc Af Amer: >60 mL/min (ref >60)
GFR calc non Af Amer: >60 mL/min (ref >60)
Glucose, Bld: 310 mg/dL — ABNORMAL HIGH (ref 65–99)
Potassium: 3.6 mmol/L (ref 3.5–5.1)
Sodium: 132 mmol/L — ABNORMAL LOW (ref 135–145)
Total Bilirubin: 0.5 mg/dL (ref 0.3–1.2)
Total Protein: 6.2 g/dL — ABNORMAL LOW (ref 6.5–8.1)

## 2015-03-22 LAB — LIPID PANEL
Cholesterol: 127 mg/dL (ref 0–200)
HDL: 33 mg/dL — ABNORMAL LOW (ref >40)
LDL Cholesterol: 54 mg/dL (ref 0–99)
Total CHOL/HDL Ratio: 3.8 RATIO
Triglycerides: 198 mg/dL — ABNORMAL HIGH (ref <150)
VLDL: 40 mg/dL (ref 0–40)

## 2015-03-22 LAB — GLUCOSE, CAPILLARY
Glucose-Capillary: 349 mg/dL — ABNORMAL HIGH (ref 65–99)
Glucose-Capillary: 318 mg/dL — ABNORMAL HIGH (ref 65–99)
Glucose-Capillary: 366 mg/dL — ABNORMAL HIGH (ref 65–99)
Glucose-Capillary: 341 mg/dL — ABNORMAL HIGH (ref 65–99)

## 2015-03-22 LAB — TROPONIN I: Troponin I: 42.05 ng/mL (ref <0.031)

## 2015-03-22 MED ORDER — PERFLUTREN LIPID MICROSPHERE
1.0000 mL | INTRAVENOUS | Status: AC | PRN
  Filled 2015-03-22: qty 10

## 2015-03-22 MED ORDER — LIVING WELL WITH DIABETES BOOK
Freq: Once | Status: AC
  Administered 2015-03-22: 13:00:00
  Filled 2015-03-22: qty 1

## 2015-03-22 MED ORDER — PERFLUTREN LIPID MICROSPHERE
INTRAVENOUS | Status: AC
  Filled 2015-03-22: qty 10

## 2015-03-22 MED ORDER — METOPROLOL TARTRATE 12.5 MG HALF TABLET
12.5000 mg | ORAL_TABLET | Freq: Two times a day (BID) | ORAL | Status: DC
  Administered 2015-03-22 (×2): 12.5 mg via ORAL
  Filled 2015-03-22 (×2): qty 1

## 2015-03-22 NOTE — Progress Notes (Signed)
    Subjective:  Denies CP or dyspnea   Objective:  Filed Vitals:   03/22/15 0600 03/22/15 0700 03/22/15 0730 03/22/15 0751  BP: 117/65 116/77 124/72   Pulse: 83 86 91   Temp:   97.7 F (36.5 C)   TempSrc:   Oral   Resp: 19 22 21    Height:      Weight:      SpO2: 93% 94% 94% 97%    Intake/Output from previous day:  Intake/Output Summary (Last 24 hours) at 03/22/15 0815 Last data filed at 03/22/15 0620  Gross per 24 hour  Intake 3360.07 ml  Output   3350 ml  Net  10.07 ml    Physical Exam: Physical exam: Well-developed well-nourished in no acute distress.  Skin is warm and dry.  HEENT is normal.  Neck is supple.  Chest is clear to auscultation with normal expansion.  Cardiovascular exam is regular rate and rhythm.  Abdominal exam nontender or distended. No masses palpated. Extremities show no edema. Right wrist with no hematoma neuro grossly intact    Lab Results: Basic Metabolic Panel:  Recent Labs  75/88/32 1404 03/22/15 0347  NA 128* 132*  K 3.7 3.6  CL 94* 97*  CO2 26 27  GLUCOSE 422* 310*  BUN 7 6  CREATININE 0.91 0.74  CALCIUM 8.3* 8.1*   CBC:  Recent Labs  03/21/15 1404 03/22/15 0347  WBC 14.2* 18.0*  NEUTROABS 10.5*  --   HGB 13.9 14.9  HCT 38.8* 42.3  MCV 87.6 88.3  PLT 257 310   Cardiac Enzymes:  Recent Labs  03/21/15 1658 03/21/15 2148 03/22/15 0347  TROPONINI 9.25* 21.71* 42.05*     Assessment/Plan:  1 STEMI-status post PCI of LAD with drug-eluting stent. Continue ASA, statin, brilinta and lotensin; add metoprolol 12.5 BID; echocardiogram pending. 2 Hypertension-continue ACEI given recent anterior MI and add metoprolol; adjust meds as needed 3 hyperlipidemia-continue statin 4 Tobacco abuse-patient counseled on discontinuing. 5 s/p pacemaker 6 DM-continue present regimen and follow CBGs. Transfer to telemetry; FU with Dr Royann Shivers following DC.  Olga Millers 03/22/2015, 8:15 AM

## 2015-03-22 NOTE — Progress Notes (Signed)
Utilization Review Completed.Frank Powell T9/10/2014  

## 2015-03-22 NOTE — Progress Notes (Signed)
Inpatient Diabetes Program Recommendations  AACE/ADA: New Consensus Statement on Inpatient Glycemic Control (2013)  Target Ranges:  Prepandial:   less than 140 mg/dL      Peak postprandial:   less than 180 mg/dL (1-2 hours)      Critically ill patients:  140 - 180 mg/dL   Reason for Visit: Hyperglycemia upon glycemic review. Results for Frank Powell, Frank Powell (MRN [id number]) as of 03/22/2015 14:54  Ref. Range 03/21/2015 15:50 03/21/2015 21:29 03/22/2015 07:32 03/22/2015 12:38  Glucose-Capillary Latest Ref Range: 65-99 mg/dL 924 (H) 268 (H) 341 (H) 318 (H)   Diabetes history: Type 2 Outpatient Diabetes medications: Glipizide/Metformin Current orders for Inpatient glycemic control: Moderate correction tidwc and HS scale.  Inpatient Diabetes Program Recommendations Insulin - Basal: Please consider addition of basal lantus of 10-15 units.  Please order HgbA1C  Thank you Lenor Coffin, RN, MSN, CDE  Diabetes Inpatient Program Office: [phone] Pager: [phone] 8:00 am to 5:00 pm

## 2015-03-22 NOTE — Progress Notes (Signed)
CRITICAL VALUE ALERT  Critical value received:  Troponin 42.05  No notification as prior critical values received.  Reported Troponin value to MD Zachery Conch.  Ivery Quale, RN

## 2015-03-22 NOTE — Progress Notes (Signed)
  Echocardiogram 2D Echocardiogram has been performed.  Cathie Beams 03/22/2015, 12:14 PM

## 2015-03-23 DIAGNOSIS — I255 Ischemic cardiomyopathy: Secondary | ICD-10-CM | POA: Diagnosis present

## 2015-03-23 DIAGNOSIS — E1159 Type 2 diabetes mellitus with other circulatory complications: Secondary | ICD-10-CM

## 2015-03-23 DIAGNOSIS — F319 Bipolar disorder, unspecified: Secondary | ICD-10-CM | POA: Diagnosis present

## 2015-03-23 DIAGNOSIS — Z91199 Patient's noncompliance with other medical treatment and regimen due to unspecified reason: Secondary | ICD-10-CM

## 2015-03-23 DIAGNOSIS — Z9861 Coronary angioplasty status: Secondary | ICD-10-CM

## 2015-03-23 DIAGNOSIS — I2510 Atherosclerotic heart disease of native coronary artery without angina pectoris: Secondary | ICD-10-CM

## 2015-03-23 DIAGNOSIS — Z9119 Patient's noncompliance with other medical treatment and regimen: Secondary | ICD-10-CM

## 2015-03-23 LAB — CBC
HCT: 41.4 % (ref 39.0–52.0)
Hemoglobin: 14.2 g/dL (ref 13.0–17.0)
MCH: 30.7 pg (ref 26.0–34.0)
MCHC: 34.3 g/dL (ref 30.0–36.0)
MCV: 89.4 fL (ref 78.0–100.0)
Platelets: 300 10*3/uL (ref 150–400)
RBC: 4.63 MIL/uL (ref 4.22–5.81)
RDW: 13.2 % (ref 11.5–15.5)
WBC: 13.6 10*3/uL — ABNORMAL HIGH (ref 4.0–10.5)

## 2015-03-23 LAB — BASIC METABOLIC PANEL
Anion gap: 7 (ref 5–15)
BUN: 7 mg/dL (ref 6–20)
CO2: 29 mmol/L (ref 22–32)
Calcium: 8.3 mg/dL — ABNORMAL LOW (ref 8.9–10.3)
Chloride: 98 mmol/L — ABNORMAL LOW (ref 101–111)
Creatinine, Ser: 0.74 mg/dL (ref 0.61–1.24)
GFR calc Af Amer: >60 mL/min (ref >60)
GFR calc non Af Amer: >60 mL/min (ref >60)
Glucose, Bld: 235 mg/dL — ABNORMAL HIGH (ref 65–99)
Potassium: 3.5 mmol/L (ref 3.5–5.1)
Sodium: 134 mmol/L — ABNORMAL LOW (ref 135–145)

## 2015-03-23 LAB — POCT ACTIVATED CLOTTING TIME: Activated Clotting Time: 589 seconds

## 2015-03-23 LAB — GLUCOSE, CAPILLARY: Glucose-Capillary: 233 mg/dL — ABNORMAL HIGH (ref 65–99)

## 2015-03-23 MED ORDER — TICAGRELOR 90 MG PO TABS: 90.0000 mg | ORAL_TABLET | Freq: Two times a day (BID) | ORAL | Status: DC

## 2015-03-23 MED ORDER — CARVEDILOL 12.5 MG PO TABS
12.5000 mg | ORAL_TABLET | Freq: Two times a day (BID) | ORAL | Status: DC
  Administered 2015-03-23: 12.5 mg via ORAL
  Filled 2015-03-23: qty 1

## 2015-03-23 MED ORDER — CARVEDILOL 12.5 MG PO TABS: 12.5000 mg | ORAL_TABLET | Freq: Two times a day (BID) | ORAL | Status: DC

## 2015-03-23 MED ORDER — ASPIRIN 81 MG PO TBEC: 81.0000 mg | DELAYED_RELEASE_TABLET | Freq: Every day | ORAL | Status: AC

## 2015-03-23 MED ORDER — ATORVASTATIN CALCIUM 80 MG PO TABS: 80.0000 mg | ORAL_TABLET | Freq: Every day | ORAL | Status: DC

## 2015-03-23 MED ORDER — NITROGLYCERIN 0.4 MG SL SUBL: 0.4000 mg | SUBLINGUAL_TABLET | SUBLINGUAL | Status: DC | PRN

## 2015-03-23 MED ORDER — INSULIN GLARGINE 100 UNIT/ML ~~LOC~~ SOLN
10.0000 [IU] | Freq: Every day | SUBCUTANEOUS | Status: DC
  Administered 2015-03-23: 10 [IU] via SUBCUTANEOUS
  Filled 2015-03-23: qty 0.1

## 2015-03-23 MED ORDER — BENAZEPRIL HCL 10 MG PO TABS
10.0000 mg | ORAL_TABLET | Freq: Every day | ORAL | Status: DC
  Administered 2015-03-23: 10 mg via ORAL
  Filled 2015-03-23: qty 1

## 2015-03-23 NOTE — Progress Notes (Signed)
Pt requesting to leave AMA, states he has obligations at home that need to be taken care of, MD aware, counseled pt to get Brilinta rx today, counseled on importance of taking as prescribed, pt verbalized understanding and stated he would pick up his prescription today.  Raymon Mutton RN

## 2015-03-23 NOTE — Discharge Summary (Signed)
Patient ID: Frank Powell,  MRN: [id number], DOB/AGE: 01-25-1961 54 y.o.  Admit date: 03/21/2015 Discharge date: 03/23/2015  Primary Care Provider: Juliette Alcide, MD Primary Cardiologist: Dr Royann Shivers  Discharge Diagnoses Principal Problem:   ST elevation (STEMI) myocardial infarction involving left anterior descending coronary artery Active Problems:   CAD S/P LAD DES 03/21/15   DM2 (diabetes mellitus, type 2)   Bipolar affective disorder   Non-compliance-left AMA 03/23/15   Cardiomyopathy, ischemic-EF 35-40%   Pacemaker - Medtronic adaptadual-chamber implanted 2004, generator change 2014   Dyslipidemia    Procedures: Urgent cath/ LAD DES 03/21/15   Hospital Course: 54 y.o. male with a history of syncope, MDT PPM insertion, normal cors 2004, COPD, DM, Bipolar d/o w/ mult remote suicide attempts. He was in his usual state of health until 03/21/15 when he had onset of substernal chest pain. The pain radiated to his L arm. It was 10/10. He tried an inhaler with no relief. When his pain did not resolve, he called EMS. His ECG was concerning for STEMI. He was given 324 mg ASA, SL NTG x 3 and 6 mg MSO4 by EMS. He came through the ER, but was taken up to the cath lab by Dr Tresa Endo. Cath revealed an occluded mid LAD, 80% distal LAD, and 80% 2nd Dx. He underwent uccessful PCI of the LAD with PTCA of the 100% occlusion and 80% mid LAD stenosis with ultimate insertion of a 3.538 mm Xience Alpine DES His EF was depressed. EF was depressed, 35-40% by echo 03/22/15. He was transferred to telelmetry and his medications adjusted. On 03/23/15 he decided he was going to leave AMA-"I have some things at home I have to take care of". He was given his prescriptions and a 30 day Brilinta card. He will be contacted about follow up as a TOC pt.   Discharge Vitals:  Blood pressure 112/71, pulse 81, temperature 98.2 F (36.8 C), temperature source Oral, resp. rate 18, height 5\' 10"  (1.778 m), weight 192 lb 12.8 oz  (87.454 kg), SpO2 96 %.    Labs: Results for orders placed or performed during the hospital encounter of 03/21/15 (from the past 24 hour(s))  Glucose, capillary     Status: Abnormal   Collection Time: 03/22/15 12:38 PM  Result Value Ref Range   Glucose-Capillary 318 (H) 65 - 99 mg/dL  Glucose, capillary     Status: Abnormal   Collection Time: 03/22/15  4:44 PM  Result Value Ref Range   Glucose-Capillary 366 (H) 65 - 99 mg/dL  Glucose, capillary     Status: Abnormal   Collection Time: 03/22/15  8:53 PM  Result Value Ref Range   Glucose-Capillary 341 (H) 65 - 99 mg/dL  Basic metabolic panel     Status: Abnormal   Collection Time: 03/23/15  3:40 AM  Result Value Ref Range   Sodium 134 (L) 135 - 145 mmol/L   Potassium 3.5 3.5 - 5.1 mmol/L   Chloride 98 (L) 101 - 111 mmol/L   CO2 29 22 - 32 mmol/L   Glucose, Bld 235 (H) 65 - 99 mg/dL   BUN 7 6 - 20 mg/dL   Creatinine, Ser 4.09 0.61 - 1.24 mg/dL   Calcium 8.3 (L) 8.9 - 10.3 mg/dL   GFR calc non Af Amer >60 >60 mL/min   GFR calc Af Amer >60 >60 mL/min   Anion gap 7 5 - 15  CBC     Status: Abnormal   Collection Time:  03/23/15  3:40 AM  Result Value Ref Range   WBC 13.6 (H) 4.0 - 10.5 K/uL   RBC 4.63 4.22 - 5.81 MIL/uL   Hemoglobin 14.2 13.0 - 17.0 g/dL   HCT 16.1 09.6 - 04.5 %   MCV 89.4 78.0 - 100.0 fL   MCH 30.7 26.0 - 34.0 pg   MCHC 34.3 30.0 - 36.0 g/dL   RDW 40.9 81.1 - 91.4 %   Platelets 300 150 - 400 K/uL  Glucose, capillary     Status: Abnormal   Collection Time: 03/23/15  7:21 AM  Result Value Ref Range   Glucose-Capillary 233 (H) 65 - 99 mg/dL    Disposition:      Follow-up Information    Follow up with Thurmon Fair, MD.   Specialty:  Cardiology   Why:  office will call you   Contact information:   [address] [place] [address] [phone]       Discharge Medications:    Medication List    TAKE these medications        albuterol 108 (90 BASE) MCG/ACT inhaler  Commonly  known as:  PROVENTIL HFA;VENTOLIN HFA  Inhale 1-2 puffs into the lungs every 6 (six) hours as needed for wheezing or shortness of breath.     ALPRAZolam 1 MG tablet  Commonly known as:  XANAX  Take 1 mg by mouth See admin instructions. Take 1 tablet 4 times daily and 2 tablets at bedtime as needed     aspirin 81 MG EC tablet  Take 1 tablet (81 mg total) by mouth daily.     atorvastatin 80 MG tablet  Commonly known as:  LIPITOR  Take 1 tablet (80 mg total) by mouth daily at 6 PM.     benazepril 5 MG tablet  Commonly known as:  LOTENSIN  Take 5 mg by mouth daily.     carvedilol 12.5 MG tablet  Commonly known as:  COREG  Take 1 tablet (12.5 mg total) by mouth 2 (two) times daily with a meal.     gabapentin 300 MG capsule  Commonly known as:  NEURONTIN  Take 300-600 mg by mouth 3 (three) times daily. Takes 1 capsule in am and at lunch and 2 capsules at night     gemfibrozil 600 MG tablet  Commonly known as:  LOPID  Take 600 mg by mouth 2 (two) times daily before a meal.     glipiZIDE-metformin 5-500 MG per tablet  Commonly known as:  METAGLIP  Take 1 tablet by mouth 2 (two) times daily before a meal.     HYDROcodone-acetaminophen 7.5-325 MG per tablet  Commonly known as:  NORCO  Take 1 tablet by mouth 3 (three) times daily.     nitroGLYCERIN 0.4 MG SL tablet  Commonly known as:  NITROSTAT  Place 1 tablet (0.4 mg total) under the tongue every 5 (five) minutes x 3 doses as needed for chest pain.     QUEtiapine 300 MG 24 hr tablet  Commonly known as:  SEROQUEL XR  Take 600 mg by mouth at bedtime.     ticagrelor 90 MG Tabs tablet  Commonly known as:  BRILINTA  Take 1 tablet (90 mg total) by mouth 2 (two) times daily.     tiotropium 18 MCG inhalation capsule  Commonly known as:  SPIRIVA  Place 18 mcg into inhaler and inhale daily.     traZODone 150 MG tablet  Commonly known as:  DESYREL  Take  150-300 mg by mouth at bedtime as needed for sleep.     ziprasidone 60 MG  capsule  Commonly known as:  GEODON  Take 60 mg by mouth 2 (two) times daily with a meal.         Duration of Discharge Encounter: Greater than 30 minutes including physician time.  Jolene Provost PA-C 03/23/2015 10:55 AM

## 2015-03-23 NOTE — Care Management Note (Signed)
Case Management Note  Patient Details  Name: GARHETT VALLA MRN: [id number] Date of Birth: 07-22-1960  Subjective/Objective:     Adm w mi               Action/Plan:lives alone   Expected Discharge Date:  03/23/15               Expected Discharge Plan:  Home/Self Care  In-House Referral:     Discharge planning Services  CM Consult, Medication Assistance  Post Acute Care Choice:    Choice offered to:     DME Arranged:    DME Agency:     HH Arranged:    HH Agency:     Status of Service:     Medicare Important Message Given:    Date Medicare IM Given:    Medicare IM give by:    Date Additional Medicare IM Given:    Additional Medicare Important Message give by:     If discussed at Long Length of Stay Meetings, dates discussed:    Additional Comments:has medicaid for meds and gluc meter and supplies. Pt leaving ama. Did give pt 30day free brilinta card and explained his copay w medicaid about 3.00 per month and how important taking brilinta is. He agrees to get filled and take for approx 1 year.  Hanley Hays, RN 03/23/2015, 10:30 AM

## 2015-03-23 NOTE — Progress Notes (Signed)
    Subjective:  Denies CP or dyspnea. " I feel great". Ambulating without difficulty.   Objective:  Filed Vitals:   03/22/15 1200 03/22/15 1526 03/22/15 2055 03/23/15 0500  BP: 142/79 120/70 124/63 112/71  Pulse: 76 85 85 81  Temp: 97.8 F (36.6 C) 97.7 F (36.5 C) 97.9 F (36.6 C) 98.2 F (36.8 C)  TempSrc: Oral Oral Oral Oral  Resp: 18 18    Height:      Weight:    87.454 kg (192 lb 12.8 oz)  SpO2: 94% 98% 97% 96%    Intake/Output from previous day:  Intake/Output Summary (Last 24 hours) at 03/23/15 0737 Last data filed at 03/22/15 2000  Gross per 24 hour  Intake    960 ml  Output   3100 ml  Net  -2140 ml    Physical Exam: Physical exam: Well-developed well-nourished in no acute distress.  Skin is warm and dry.  HEENT is normal. Missing dentition. Neck is supple.  Chest is clear to auscultation with normal expansion.  Cardiovascular exam is regular rate and rhythm. Normal S1-2 without murmur or gallop  Abdominal exam nontender or distended. No masses palpated. Extremities show no edema. Right wrist with no hematoma neuro grossly intact    Lab Results: Basic Metabolic Panel:  Recent Labs  93/81/82 0347 03/23/15 0340  NA 132* 134*  K 3.6 3.5  CL 97* 98*  CO2 27 29  GLUCOSE 310* 235*  BUN 6 7  CREATININE 0.74 0.74  CALCIUM 8.1* 8.3*   CBC:  Recent Labs  03/21/15 1404 03/22/15 0347 03/23/15 0340  WBC 14.2* 18.0* 13.6*  NEUTROABS 10.5*  --   --   HGB 13.9 14.9 14.2  HCT 38.8* 42.3 41.4  MCV 87.6 88.3 89.4  PLT 257 310 300   Cardiac Enzymes:  Recent Labs  03/21/15 1658 03/21/15 2148 03/22/15 0347  TROPONINI 9.25* 21.71* 42.05*   Ecg today shows NSR with evolving anterior infarct. I have personally reviewed and interpreted this study.  Echo:Study Conclusions  - Left ventricle: LVEF is approxiamtely 35 to 40% with akinesis of ht mid/distal septal, mid/distal inferior and apical walls. The cavity size was normal. Doppler  parameters are consistent with abnormal left ventricular relaxation (grade 1 diastolic dysfunction).   Assessment/Plan:  1 STEMI-status post PCI of LAD with drug-eluting stent. Continue ASA, statin, brilinta and lotensin; switch metoprolol to carvedilol. EF 35-40%.  2 Hypertension-continue ACEI given recent anterior MI and add metoprolol; adjust meds as needed 3 hyperlipidemia-continue high dose statin 4 Tobacco abuse-patient counseled on discontinuing. 5 s/p pacemaker 6 DM-continue CBGs improving but still high. Will start lantus 10 units daily. Plan to resume metformin at DC. 7. Ischemic cardiomyopathy. Will titrate beta blocker and ACEi as BP tolerates. Consider low dose aldactone at DC.  Anticipate DC in am if stable.   Theron Arista [facility] 03/23/2015, 7:37 AM

## 2015-03-23 NOTE — Progress Notes (Signed)
Pt left facility AMA, counseled to take Brilinta to avoid readmission, pt verbalized understanding.  Raymon Mutton RN

## 2015-03-23 NOTE — Discharge Instructions (Signed)
Myocardial Infarction °A myocardial infarction (MI) is damage to the heart that is not reversible. It is also called a heart attack. An MI usually occurs when a heart (coronary) artery becomes blocked or narrowed. This cuts off the blood supply to the heart. When one or more of the heart (coronary) arteries becomes blocked, that area of the heart begins to die. This causes pain felt during an MI.  °If you think you might be having an MI, call your local emergency services immediately (911 in [place] It is recommended that you chew and swallow 3 non-enteric coated baby aspirin if you do not have an aspirin allergy. Do not drive yourself to the hospital or wait to see if your symptoms go away. The sooner MI is treated, the greater the amount of heart muscle saved. Time is muscle. It can save your life. °CAUSES  °An MI can occur from: °· A gradual buildup of a fatty substance called plaque. When plaque builds up in the arteries, this condition is called atherosclerosis. This buildup can block or reduce the blood supply to the heart artery(s). °· A sudden plaque rupture within a heart artery that causes a blood clot (thrombus). A blood clot can block the heart artery which does not allow blood flow to the heart. °· A severe tightening (spasm) of the heart artery. This is a less common cause of a heart attack. When a heart artery spasms, it cuts off blood flow through the artery. Spasms can occur in heart arteries that do not have atherosclerosis. °RISK FACTORS °People at risk for an MI usually have one or more risk factors, such as: °· High blood pressure. °· High cholesterol. °· Smoking. °· Gender. Men have a higher heart attack risk. °· Overweight/obesity. °· Age. °· Family history. °· Lack of exercise. °· Diabetes. °· Stress. °· Excessive alcohol use. °· Street drug use (cocaine and methamphetamines). °SYMPTOMS  °MI symptoms can vary, such as: °· In both men and women, MI symptoms can include the following: °· Chest  pain. The chest pain may feel like a crushing, squeezing, or "pressure" type feeling. MI pain can be "referred," meaning pain can be caused in one part of the body but felt in another part of the body. Referred MI pain may occur in the left arm, neck, or jaw. Pain may even be felt in the right arm. °· Shortness of breath (dyspnea). °· Heartburn or indigestion with or without vomiting, shortness of breath, or sweating (diaphoresis). °· Sudden, cold sweats. °· Sudden lightheadedness. °· Upper back pain. °· Women can have unique MI symptoms, such as: °· Unexplained feelings of nervousness or anxiety. °· Discomfort between the shoulder blades (scapula) or upper back. °· Tingling in the hands and arms. °· In elderly people (regardless of gender), MI symptoms can be subtle, such as: °· Sweating (diaphoresis). °· Shortness of breath (dyspnea). °· General tiredness (fatigue) or not feeling well (malaise). °DIAGNOSIS  °Diagnosis of an MI involves several tests such as: °· An assessment of your vital signs such as heart rhythm, blood pressure, respiratory rate, and oxygen level. °· An EKG (ECG) to look at the electrical activity of your heart. °· Blood tests called cardiac markers are drawn at scheduled times to measure proteins or enzymes released by the damaged heart muscle. °· A chest X-ray. °· An echocardiogram to evaluate heart motion and blood flow. °· Coronary angiography (cardiac catheterization). This is a diagnostic procedure to look at the heart arteries. °TREATMENT  °Acute Intervention. For   an MI, the national standard in the [place] is to have an acute intervention in under 90 minutes from the time you get to the hospital. An acute intervention is a special procedure to open up the heart arteries. It is done in a treatment room called a "catheterization lab" (cath lab). Some hospitals do no have a cath lab. If you are having an MI and the hospital does not have a cath lab, the standard is to transport you  to a hospital that has one. In the cath lab, acute intervention includes:  Angioplasty. An angioplasty involves inserting a thin, flexible tube (catheter) into an artery in either your groin or wrist. The catheter is threaded to the heart arteries. A balloon at the end of the catheter is inflated to open a narrowed or blocked heart artery. During an angioplasty procedure, a small mesh tube (stent) may be used to keep the heart artery open. Depending on your condition and health history, one of two types of stents may be placed:  Drug-eluting stent (DES). A DES is coated with a medicine to prevent scar tissue from growing over the stent. With drug-eluting stents, blood thinning medicine will need to be taken for up to a year.  Bare metal stent. This type of stent has no special coating to keep tissue from growing over it. This type of stent is used if you cannot take blood thinning medicine for a prolonged time or you need surgery in the near future. After a bare metal stent is placed, blood thinning medicine will need to be taken for about a month.  If you are taking blood thinning medicine (anti-platelet therapy) after stent placement, do not stop taking it unless your caregiver says it is okay to do so. Make sure you understand how long you need to take the medicine. Surgical Intervention  If an acute intervention is not successful, surgery may be needed:  Open heart surgery (coronary artery bypass graft, CABG). CABG takes a vein (saphenous vein) from your leg. The vein is then attached to the blocked heart artery which bypasses the blockage. This then allows blood flow to the heart muscle. Additional Interventions  A "clot buster" medicine (thrombolytic) may be given. This medicine can help break up a clot in the heart artery. This medicine may be given if a person cannot get to a cath lab right away.  Intra-aortic balloon pump (IABP). If you have suffered a very severe MI and are too unstable  to go to the cath lab or to surgery, an IABP may be used. This is a temporary mechanical device used to increase blood flow to the heart and reduce the workload of the heart until you are stable enough to go to the cath lab or surgery. HOME CARE INSTRUCTIONS After an MI, you may need the following:  Medicine. Take medicine as directed by your caregiver. Medicines after an MI may:  Keep your blood from clotting easily (blood thinners).  Control your blood pressure.  Help lower your cholesterol.  Control abnormal heart rhythms.  Lifestyle changes. Under the guidance of your caregiver, lifestyle changes include:  Quitting smoking, if you smoke. Your caregiver can help you quit.  Being physically active.  Maintaining a healthy weight.  Eating a heart healthy diet. A dietitian can help you learn healthy eating options.  Managing diabetes.  Reducing stress.  Limiting alcohol intake. SEEK IMMEDIATE MEDICAL CARE IF:   You have severe chest pain, especially if the pain is crushing  or pressure-like and spreads to the arms, back, neck, or jaw. This is an emergency. Do not wait to see if the pain will go away. Get medical help at once. Call your local emergency services (911 in the [place] Do not drive yourself to the hospital.  You have shortness of breath during rest, sleep, or with activity.  You have sudden sweating or clammy skin.  You feel sick to your stomach (nauseous) and throw up (vomit).  You suddenly become lightheaded or dizzy.  You feel your heart beating rapidly or you notice "skipped" beats. MAKE SURE YOU:   Understand these instructions.  Will watch your condition.  Will get help right away if you are not doing well or get worse. Document Released: 07/04/2005 Document Revised: 07/09/2013 Document Reviewed: 09/06/2013 [facility] Patient Information 2015 [place], [place]. This information is not intended to replace advice given to you by your health care provider.  Make sure you discuss any questions you have with your health care provider. Ticagrelor oral tablet What is this medicine? TICAGRELOR (TYE ka GREL or) helps to prevent blood clots. This medicine is used to prevent heart attack, stroke, or other vascular events in people who have had a recent heart attack or who have severe chest pain. This medicine may be used for other purposes; ask your health care provider or pharmacist if you have questions. COMMON BRAND NAME(S): BRILINTA What should I tell my health care provider before I take this medicine? They need to know if you have any of these conditions: -bleeding disorder -bleeding in the brain -liver disease -planned surgery -stomach or intestinal ulcers -stroke or transient ischemic attack -an unusual or allergic reaction to ticagrelor, other medicines, foods, dyes, or preservatives -pregnant or trying to get pregnant -breast-feeding How should I use this medicine? Take this medicine by mouth with a glass of water. Follow the directions on the prescription label. You can take it with or without food. If it upsets your stomach, take it with food. Take your medicine at regular intervals. Do not take it more often than directed. Do not stop taking except on your doctor's advice. Talk to you pediatrician regarding the use of this medicine in children. Special care may be needed. Overdosage: If you think you've taken too much of this medicine contact a poison control center or emergency room at once. Overdosage: If you think you have taken too much of this medicine contact a poison control center or emergency room at once. NOTE: This medicine is only for you. Do not share this medicine with others. What if I miss a dose? If you miss a dose, take it as soon as you can. If it is almost time for your next dose, take only that dose. Do not take double or extra doses. What may interact with this medicine? -certain antibiotics like clarithromycin and  telithromycin -certain medicines for fungal infections like itraconazole, ketoconazole, and voriconazole -certain medicines for HIV infection like atazanavir, indinavir, nelfinavir, ritonavir, and saquinavir -certain medicines for seizures like carbamazepine, phenobarbital, and phenytoin -certain medicines that treat or prevent blood clots like warfarin -dexamethasone -digoxin -lovastatin -nefazodone -rifampin -simvastatin This list may not describe all possible interactions. Give your health care provider a list of all the medicines, herbs, non-prescription drugs, or dietary supplements you use. Also tell them if you smoke, drink alcohol, or use illegal drugs. Some items may interact with your medicine. What should I watch for while using this medicine? Visit your doctor or health care professional for  regular check ups. Do not stop taking you medicine unless your doctor tells you to. Notify your doctor or health care professional and seek emergency treatment if you develop breathing problems; changes in vision; chest pain; severe, sudden headache; pain, swelling, warmth in the leg; trouble speaking; sudden numbness or weakness of the face, arm, or leg. These can be signs that your condition has gotten worse. If you are going to have surgery or dental work, tell your doctor or health care professional that you are taking this medicine. You should take aspirin every day with this medicine. Do not take more than 100 mg each day. Talk to your doctor if you have questions. What side effects may I notice from receiving this medicine? Side effects that you should report to your doctor or health care professional as soon as possible: -allergic reactions like skin rash, itching or hives, swelling of the face, lips, or tongue -breathing problems -fast or irregular heartbeat -feeling faint or light-headed, falls -signs and symptoms of bleeding such as bloody or black, tarry stools; red or dark-brown  urine; spitting up blood or brown material that looks like coffee grounds; red spots on the skin; unusual bruising or bleeding from the eye, gums, or nose Side effects that usually do not require medical attention (Report these to your doctor or health care professional if they continue or are bothersome.): -breast enlargement in both males and females -diarrhea -dizziness -headache -tiredness -upset stomach This list may not describe all possible side effects. Call your doctor for medical advice about side effects. You may report side effects to FDA at 1-800-FDA-1088. Where should I keep my medicine? Keep out of the reach of children. Store at room temperature of 59 to 86 degrees F (15 to 30 degrees C). Throw away any unused medicine after the expiration date. NOTE: This sheet is a summary. It may not cover all possible information. If you have questions about this medicine, talk to your doctor, pharmacist, or health care provider.  2015, [employer]vier/Gold Standard. (2013-10-14 08:31:23) Coronary Angiogram With Stent, Care After Refer to this sheet in the next few weeks. These instructions provide you with information on caring for yourself after your procedure. Your health care provider may also give you more specific instructions. Your treatment has been planned according to current medical practices, but problems sometimes occur. Call your health care provider if you have any problems or questions after your procedure.  WHAT TO EXPECT AFTER THE PROCEDURE  The insertion site may be tender for a few days after your procedure. HOME CARE INSTRUCTIONS   Take medicines only as directed by your health care provider. Blood thinners may be prescribed after your procedure to improve blood flow through the stent.  Change any bandages (dressings) as directed by your health care provider.   Check your insertion site every day for redness, swelling, or fluid leaking from the insertion.   Do not take  baths, swim, or use a hot tub until your health care provider approves. You may shower. Pat the insertion area dry. Do not rub the insertion area with a washcloth or towel.   Eat a heart-healthy diet. This should include plenty of fresh fruits and vegetables. Meat should be lean cuts. Avoid the following types of food:   Food that is high in salt.   Canned or highly processed food.   Food that is high in saturated fat or sugar.   Fried food.   Make any other lifestyle changes recommended by your  health care provider. This may include:   Not using any tobacco products including cigarettes, chewing tobacco, or electronic cigarettes.  Managing your weight.   Getting regular exercise.   Managing your blood pressure.   Limiting your alcohol intake.   Managing other health problems, such as diabetes.   If you need an MRI after your heart stent was placed, be sure to tell the health care provider who orders the MRI that you have a heart stent.   Keep all follow-up visits as directed by your health care provider.  SEEK IMMEDIATE MEDICAL CARE IF:   You develop chest pain, shortness of breath, feel faint, or pass out.  You have bleeding, swelling larger than a walnut, or drainage from the catheter insertion site.  You develop pain, discoloration, coldness, or severe bruising in the leg or arm that held the catheter.  You develop bleeding from any other place such as from the bowels. There may be bright red blood in the urine or stools, or it may appear as black, tarry stools.  You have a fever or chills. MAKE SURE YOU:  Understand these instructions.  Will watch your condition.  Will get help right away if you are not doing well or get worse. Document Released: 01/21/2005 Document Revised: 11/18/2013 Document Reviewed: 12/05/2012 [facility] Patient Information 2015 [place], [place]. This information is not intended to replace advice given to you by your health care  provider. Make sure you discuss any questions you have with your health care provider.

## 2015-03-24 ENCOUNTER — Telehealth (HOSPITAL_COMMUNITY): Payer: Self-pay | Admitting: *Deleted

## 2015-03-24 ENCOUNTER — Telehealth: Payer: Self-pay | Admitting: Cardiovascular Disease

## 2015-03-24 ENCOUNTER — Encounter (HOSPITAL_COMMUNITY): Payer: Self-pay | Admitting: Cardiovascular Disease

## 2015-03-24 LAB — HEMOGLOBIN A1C
Hgb A1c MFr Bld: 14.3 % — ABNORMAL HIGH (ref 4.8–5.6)
Mean Plasma Glucose: 364 mg/dL

## 2015-03-24 MED FILL — Verapamil HCl IV Soln 2.5 MG/ML: INTRAVENOUS | Qty: 2 | Status: AC

## 2015-03-24 NOTE — Telephone Encounter (Signed)
TOC Phone call appt on 04/06/15

## 2015-03-24 NOTE — Telephone Encounter (Signed)
Patient contacted regarding discharge from [facility] on 03/23/2015.  Patient understands to follow up with provider L. Annie Paras, NP on 04/06/15 at 130pm at [facility]. Patient understands discharge instructions? YES Patient understands medications and regiment? YES Patient understands to bring all medications to this visit? YES

## 2015-04-06 ENCOUNTER — Ambulatory Visit: Payer: Medicaid Other | Admitting: Cardiology

## 2015-04-14 ENCOUNTER — Ambulatory Visit: Payer: Medicaid Other | Admitting: Physician Assistant

## 2015-04-21 ENCOUNTER — Encounter: Payer: Self-pay | Admitting: Physician Assistant

## 2015-04-21 NOTE — Progress Notes (Signed)
Cardiology Office Note   Date:  04/22/2015   ID:  Frank Powell, DOB [birth year], MRN [id number]  PCP:  Juliette Alcide, MD  Cardiologist:  Dr Chrisandra Netters, PA-C   Chief Complaint  Patient presents with  . Hospitalization Follow-up    patient reports 1 episode of chest pain - took 1 NTG with relief, and dizziness - when he stands up.    History of Present Illness: Frank Powell is a 54 y.o. male with a history of bipolar, DM, ICM, MDT PPM for syncope, COPD, HL, d/c 09/05 after STEMI w/ DES LAD  Frank Powell presents for post hospital follow-up.  Since discharge in the hospital, he has done fairly well. He had one episode of chest pain which she treated with one sublingual nitroglycerin and it resolved. He has been able to increase his activity.  He is not doing cardiac rehabilitation, but is able to walk 2 miles a day and has not been getting chest pain. He states he stays busy around the house all the time. He has very little money, and feels he would not be able to afford cardiac rehabilitation.  He is compliant with his medications and states that he was able to get all of them at the drugstore. He was concerned about the Brilinta, but it was free this month.  He has not had lower extremity edema, dyspnea on exertion or palpitations. He feels he is doing very well.  He is still smoking, but states he has cut back and is planning to quit.  Past Medical History  Diagnosis Date  . Bipolar 1 disorder (HCC)   . Multiple personality   . Atrial fib/flutter, transient   . Myocardial infarct (HCC) 03/21/2015    DES LAD  . Pacemaker 06/02/2013  . DM2 (diabetes mellitus, type 2) (HCC) 06/02/2013  . Neurocardiogenic syncope 06/02/2013  . Normal coronary arteries    . Obesity (BMI 30.0-34.9) 06/02/2013  . Hypertriglyceridemia 06/02/2013    Past Surgical History  Procedure Laterality Date  . Pacemaker insertion Left 02/03/2003    [employer]  . Nm myocar perf  wall motion  10/06/2011    No ischemia  . Cardiac catheterization  09/26/2002    normal coronary arteries and LV  . Permanent pacemaker generator change N/A 09/28/2012    Procedure: PERMANENT PACEMAKER GENERATOR CHANGE;  Surgeon: Thurmon Fair, MD; [employer]tr[employer]   . Cardiac catheterization N/A 03/21/2015    Procedure: Left Heart Cath and Coronary Angiography;  Surgeon: Lennette Bihari, MD; mLAD 100%, dLAD 80%, D2 80%, EF 50-55%  . Cardiac catheterization N/A 03/21/2015    Procedure: Coronary Stent Intervention;  Surgeon: Lennette Bihari, MD; 3.538 mm Xience Alpine DES to the LAD     Current Outpatient Prescriptions  Medication Sig Dispense Refill  . albuterol (PROVENTIL HFA;VENTOLIN HFA) 108 (90 BASE) MCG/ACT inhaler Inhale 1-2 puffs into the lungs every 6 (six) hours as needed for wheezing or shortness of breath.    . ALPRAZolam (XANAX) 1 MG tablet Take 1 mg by mouth See admin instructions. Take 1 tablet 4 times daily and 2 tablets at bedtime as needed    . aspirin EC 81 MG EC tablet Take 1 tablet (81 mg total) by mouth daily.    Marland Kitchen atorvastatin (LIPITOR) 80 MG tablet Take 1 tablet (80 mg total) by mouth daily at 6 PM. 30 tablet 11  . benazepril (LOTENSIN) 5 MG tablet Take 5 mg by mouth daily.    Marland Kitchen  carvedilol (COREG) 12.5 MG tablet Take 1 tablet (12.5 mg total) by mouth 2 (two) times daily with a meal. 60 tablet 11  . gabapentin (NEURONTIN) 300 MG capsule Take 300-600 mg by mouth 3 (three) times daily. Takes 1 capsule in am and at lunch and 2 capsules at night    . gemfibrozil (LOPID) 600 MG tablet Take 600 mg by mouth 2 (two) times daily before a meal.    . glipiZIDE-metformin (METAGLIP) 5-500 MG per tablet Take 1 tablet by mouth 2 (two) times daily before a meal.    . HYDROcodone-acetaminophen (NORCO) 7.5-325 MG per tablet Take 1 tablet by mouth 3 (three) times daily.    . nitroGLYCERIN (NITROSTAT) 0.4 MG SL tablet Place 1 tablet (0.4 mg total) under the tongue every 5 (five) minutes x 3 doses as  needed for chest pain. 25 tablet 12  . QUEtiapine (SEROQUEL XR) 300 MG 24 hr tablet Take 600 mg by mouth at bedtime.    . ticagrelor (BRILINTA) 90 MG TABS tablet Take 1 tablet (90 mg total) by mouth 2 (two) times daily. 60 tablet 11  . tiotropium (SPIRIVA) 18 MCG inhalation capsule Place 18 mcg into inhaler and inhale daily.    . traZODone (DESYREL) 150 MG tablet Take 150-300 mg by mouth at bedtime as needed for sleep.     . ziprasidone (GEODON) 60 MG capsule Take 60 mg by mouth 2 (two) times daily with a meal.      No current facility-administered medications for this visit.    Allergies:   Shellfish allergy    Social History:  The patient  reports that he has been smoking.  He does not have any smokeless tobacco history on file. He reports that he does not drink alcohol or use illicit drugs.   Family History:  The patient's family history includes Coronary artery disease in his father; Diabetes in his father and mother.    ROS:  Please see the history of present illness. All other systems are reviewed and negative.   PHYSICAL EXAM: VS:  BP 104/70 mmHg  Pulse 76  Ht  (1.778 m)  Wt 198 lb (89.812 kg)  BMI 28.41 kg/m2 , BMI Body mass index is 28.41 kg/(m^2). GEN: Well nourished, well developed, male in no acute distress HEENT: normal for age  Neck: no JVD, no carotid bruit, no masses Cardiac: RRR; no murmur, no rubs, or gallops Respiratory:  Rales in bases and a few rhonchi, normal work of breathing GI: soft, nontender, nondistended, + BS MS: no deformity or atrophy; no edema; distal pulses are 2+ in all 4 extremities  Skin: warm and dry, no rash Neuro:  Strength and sensation are intact Psych: euthymic mood, full affect   EKG:  EKG is ordered today. The ekg ordered today demonstrates sinus rhythm, post MI changes, but improved from previous ECG.   Recent Labs: 03/21/2015: TSH 1.670 03/22/2015: ALT 25 03/23/2015: BUN 7; Creatinine, Ser 0.74; Hemoglobin 14.2; Platelets  300; Potassium 3.5; Sodium 134*    Lipid Panel    Component Value Date/Time   CHOL 127 03/22/2015 0347   TRIG 198* 03/22/2015 0347   HDL 33* 03/22/2015 0347   CHOLHDL 3.8 03/22/2015 0347   VLDL 40 03/22/2015 0347   LDLCALC 54 03/22/2015 0347     Wt Readings from Last 3 Encounters:  04/22/15 198 lb (89.812 kg)  03/23/15 192 lb 12.8 oz (87.454 kg)  09/26/13 220 lb 12.8 oz (100.154 kg)     Other  studies Reviewed: Additional studies/ records that were reviewed today include: Hospital records, previous ECG.  ASSESSMENT AND PLAN:  1.  H/o anterior STEMI with DES LAD: He is compliant with his medications. Our office contacted his pharmacy and was advised that his ticagrelor will be free going forward so compliance should be good. He refuses cardiac rehabilitation due to financial reasons, but is successfully increasing his activity and is encouraged to continue this. His blood pressure and heart rate are well controlled and no medication changes are indicated.  2. Tobacco use: He is continuing to smoke. 15 minutes was spent discussing smoking cessation with the patient and he is strongly encouraged to do so.  Current medicines are reviewed at length with the patient today.  The patient does not have concerns regarding medicines.  The following changes have been made:  no change  Labs/ tests ordered today include:   ECG   Disposition:   FU with Dr. Royann Shivers in 3 months  Signed, Leanna Battles  04/22/2015 5:49 PM    [facility] [address] [place], [place], [place]  [address] Phone: [phone]; Fax: [phone]

## 2015-04-22 ENCOUNTER — Encounter: Payer: Self-pay | Admitting: Physician Assistant

## 2015-04-22 ENCOUNTER — Ambulatory Visit (INDEPENDENT_AMBULATORY_CARE_PROVIDER_SITE_OTHER): Payer: Medicare Other | Admitting: Physician Assistant

## 2015-04-22 VITALS — BP 104/70 | HR 76 | Ht 70.0 in | Wt 198.0 lb

## 2015-04-22 DIAGNOSIS — I2102 ST elevation (STEMI) myocardial infarction involving left anterior descending coronary artery: Secondary | ICD-10-CM

## 2015-04-22 DIAGNOSIS — Z9861 Coronary angioplasty status: Secondary | ICD-10-CM | POA: Diagnosis not present

## 2015-04-22 DIAGNOSIS — I2510 Atherosclerotic heart disease of native coronary artery without angina pectoris: Secondary | ICD-10-CM

## 2015-04-22 DIAGNOSIS — Z720 Tobacco use: Secondary | ICD-10-CM

## 2015-04-22 NOTE — Patient Instructions (Signed)
Rhonda Barrett, PA-C, has made no changes in your current medications or treatment plan.  Your physician recommends that you schedule a follow-up appointment in 3 months with Dr Royann Shivers.

## 2015-05-05 NOTE — Addendum Note (Signed)
Addended by: Neta Ehlers on: 05/05/2015 02:02 PM   Modules accepted: Orders

## 2015-05-26 ENCOUNTER — Encounter: Payer: Medicaid Other | Admitting: Cardiovascular Disease

## 2015-06-16 ENCOUNTER — Encounter: Payer: Medicaid Other | Admitting: Cardiovascular Disease

## 2015-07-30 DIAGNOSIS — I10 Essential (primary) hypertension: Secondary | ICD-10-CM | POA: Diagnosis not present

## 2015-07-30 DIAGNOSIS — E1159 Type 2 diabetes mellitus with other circulatory complications: Secondary | ICD-10-CM | POA: Diagnosis not present

## 2015-07-30 DIAGNOSIS — Z0000 Encounter for general adult medical examination without abnormal findings: Secondary | ICD-10-CM | POA: Diagnosis not present

## 2015-08-21 ENCOUNTER — Encounter: Payer: Medicaid Other | Admitting: Cardiovascular Disease

## 2015-08-26 ENCOUNTER — Encounter: Payer: Self-pay | Admitting: Cardiovascular Disease

## 2015-08-26 ENCOUNTER — Ambulatory Visit (INDEPENDENT_AMBULATORY_CARE_PROVIDER_SITE_OTHER): Payer: Medicare Other | Admitting: Cardiovascular Disease

## 2015-08-26 VITALS — BP 110/75 | HR 75 | Ht 70.0 in | Wt 191.5 lb

## 2015-08-26 DIAGNOSIS — Z950 Presence of cardiac pacemaker: Secondary | ICD-10-CM

## 2015-08-26 DIAGNOSIS — R55 Syncope and collapse: Secondary | ICD-10-CM

## 2015-08-26 DIAGNOSIS — I200 Unstable angina: Secondary | ICD-10-CM

## 2015-08-26 DIAGNOSIS — Z720 Tobacco use: Secondary | ICD-10-CM

## 2015-08-26 DIAGNOSIS — I25110 Atherosclerotic heart disease of native coronary artery with unstable angina pectoris: Secondary | ICD-10-CM

## 2015-08-26 DIAGNOSIS — E1159 Type 2 diabetes mellitus with other circulatory complications: Secondary | ICD-10-CM

## 2015-08-26 DIAGNOSIS — I255 Ischemic cardiomyopathy: Secondary | ICD-10-CM | POA: Diagnosis not present

## 2015-08-26 DIAGNOSIS — E785 Hyperlipidemia, unspecified: Secondary | ICD-10-CM

## 2015-08-26 NOTE — Patient Instructions (Signed)
Your physician has requested that you have a lexiscan myoview AT [facility]. For further information please visit [email or web address] Please follow instruction sheet, as given.  Dr. Royann Shivers recommends that you schedule a follow-up appointment in: 6 MONTHS

## 2015-08-26 NOTE — Progress Notes (Signed)
Patient ID: Frank Powell, male   DOB: Nov 20, 1960, 55 y.o.   MRN: [id number]     Cardiology Office Note    Date:  08/26/2015   ID:  Frank Powell, DOB 06-25-1961, MRN [id number]  PCP:  Pcp Not In System  Cardiologist:   Thurmon Fair, MD   Chief Complaint  Patient presents with  . Annual Exam    pt c/o chest pain last week--took 3 nitro before it got better  . Shortness of Breath    on exertion  . Dizziness    random    History of Present Illness:  Frank Powell is a 55 y.o. male with a history of premature onset coronary artery disease, status post ST segment elevation anterior wall myocardial infarction in September 2016 treated with placement of a drug-eluting stent in the mid LAD artery (3.538 mm Xience Alpine ). He continues to be a heavy smoker, has poorly controlled diabetes mellitus (last hemoglobin A1c 13%) has hyperlipidemia and a history of noncompliance (possibly related to bipolar disorder). Left ventricular ejection fraction was 35-40 % shortly following his infarction.  Roughly one week ago he had sudden onset of chest pressure at rest similar to his previous infarction pain. It resolved finally after the third sublingual nitroglycerin, when he was almost ready to call 911. It has not recurred since. He reports compliance with aspirin and Brilinta. He denies exertional dyspnea, but is very sedentary. He has not had lower extremity edema, claudication or focal neurological complaints. His electrocardiogram today shows sinus rhythm with Q waves in the septal leads and nonspecific T-wave changes.  He has a history of neurocardiogenic/cardioinhibitory syncope that has largely resolved following implantation of a dual-chamber permanent pacemaker in 2014. Device interrogation shows normal function. 12.5 years of generator longevity remain. He only has 0.2% atrial pacing and 0.6% ventricular pacing, with a device set at a lower rate limit of 50 bpm.    Past Medical History    Diagnosis Date  . Bipolar 1 disorder (HCC)   . Multiple personality   . Atrial fib/flutter, transient   . Myocardial infarct (HCC) 03/21/2015    DES LAD  . Pacemaker 06/02/2013  . DM2 (diabetes mellitus, type 2) (HCC) 06/02/2013  . Neurocardiogenic syncope 06/02/2013  . Normal coronary arteries    . Obesity (BMI 30.0-34.9) 06/02/2013  . Hypertriglyceridemia 06/02/2013    Past Surgical History  Procedure Laterality Date  . Pacemaker insertion Left 02/03/2003    Medtronic  . Nm myocar perf wall motion  10/06/2011    No ischemia  . Cardiac catheterization  09/26/2002    normal coronary arteries and LV  . Permanent pacemaker generator change N/A 09/28/2012    Procedure: PERMANENT PACEMAKER GENERATOR CHANGE;  Surgeon: Thurmon Fair, MD; Medtronic   . Cardiac catheterization N/A 03/21/2015    Procedure: Left Heart Cath and Coronary Angiography;  Surgeon: Lennette Bihari, MD; mLAD 100%, dLAD 80%, D2 80%, EF 50-55%  . Cardiac catheterization N/A 03/21/2015    Procedure: Coronary Stent Intervention;  Surgeon: Lennette Bihari, MD; 3.538 mm Xience Alpine DES to the LAD     Outpatient Prescriptions Prior to Visit  Medication Sig Dispense Refill  . albuterol (PROVENTIL HFA;VENTOLIN HFA) 108 (90 BASE) MCG/ACT inhaler Inhale 1-2 puffs into the lungs every 6 (six) hours as needed for wheezing or shortness of breath.    . ALPRAZolam (XANAX) 1 MG tablet Take 1 mg by mouth See admin instructions. Take 1 tablet 4 times daily and 2  tablets at bedtime as needed    . aspirin EC 81 MG EC tablet Take 1 tablet (81 mg total) by mouth daily.    Marland Kitchen atorvastatin (LIPITOR) 80 MG tablet Take 1 tablet (80 mg total) by mouth daily at 6 PM. 30 tablet 11  . benazepril (LOTENSIN) 5 MG tablet Take 5 mg by mouth daily.    . carvedilol (COREG) 12.5 MG tablet Take 1 tablet (12.5 mg total) by mouth 2 (two) times daily with a meal. 60 tablet 11  . gabapentin (NEURONTIN) 300 MG capsule Take 300-600 mg by mouth 3 (three) times  daily. Takes 1 capsule in am and at lunch and 2 capsules at night    . gemfibrozil (LOPID) 600 MG tablet Take 600 mg by mouth 2 (two) times daily before a meal.    . glipiZIDE-metformin (METAGLIP) 5-500 MG per tablet Take 1 tablet by mouth 2 (two) times daily before a meal.    . HYDROcodone-acetaminophen (NORCO) 7.5-325 MG per tablet Take 1 tablet by mouth 3 (three) times daily.    Marland Kitchen LEVEMIR FLEXTOUCH 100 UNIT/ML Pen Inject 20 Units into the skin daily.    . nitroGLYCERIN (NITROSTAT) 0.4 MG SL tablet Place 1 tablet (0.4 mg total) under the tongue every 5 (five) minutes x 3 doses as needed for chest pain. 25 tablet 12  . QUEtiapine (SEROQUEL XR) 300 MG 24 hr tablet Take 600 mg by mouth at bedtime.    . ticagrelor (BRILINTA) 90 MG TABS tablet Take 1 tablet (90 mg total) by mouth 2 (two) times daily. 60 tablet 11  . tiotropium (SPIRIVA) 18 MCG inhalation capsule Place 18 mcg into inhaler and inhale daily.    . traZODone (DESYREL) 150 MG tablet Take 150-300 mg by mouth at bedtime as needed for sleep.     . ziprasidone (GEODON) 60 MG capsule Take 60 mg by mouth 2 (two) times daily with a meal.      No facility-administered medications prior to visit.     Allergies:   Shellfish allergy   Social History   Social History  . Marital Status: Divorced    Spouse Name: N/A  . Number of Children: N/A  . Years of Education: N/A   Occupational History  . Disabled    Social History Main Topics  . Smoking status: Current Every Day Smoker  . Smokeless tobacco: None  . Alcohol Use: No  . Drug Use: No  . Sexual Activity: Not Asked   Other Topics Concern  . None   Social History Narrative   Lives in [place], [place].     Family History:  The patient's family history includes Coronary artery disease in his father; Diabetes in his father and mother.   ROS:   Please see the history of present illness.    ROS All other systems reviewed and are negative.   PHYSICAL EXAM:   VS:  BP 110/75 mmHg  Pulse  75  Ht  (1.778 m)  Wt 86.864 kg (191 lb 8 oz)  BMI 27.48 kg/m2   GEN: Well nourished, well developed, in no acute distress HEENT: normal Neck: no JVD, carotid bruits, or masses Cardiac: RRR; no murmurs, rubs, or gallops,no edema ; healthy left subclavian pacemaker site Respiratory:  clear to auscultation bilaterally, normal work of breathing GI: soft, nontender, nondistended, + BS MS: no deformity or atrophy Skin: warm and dry, no rash Neuro:  Alert and Oriented x 3, Strength and sensation are intact Psych: euthymic mood, full  affect  Wt Readings from Last 3 Encounters:  08/26/15 86.864 kg (191 lb 8 oz)  04/22/15 89.812 kg (198 lb)  03/23/15 87.454 kg (192 lb 12.8 oz)      Studies/Labs Reviewed:   EKG:  EKG is ordered today.  The ekg ordered today demonstrates normal sinus rhythm, QS pattern in V1-V2, nonspecific T-wave abnormality, QTC 437 ms  Recent Labs: 03/21/2015: TSH 1.670 03/22/2015: ALT 25 03/23/2015: BUN 7; Creatinine, Ser 0.74; Hemoglobin 14.2; Platelets 300; Potassium 3.5; Sodium 134*   Lipid Panel    Component Value Date/Time   CHOL 127 03/22/2015 0347   TRIG 198* 03/22/2015 0347   HDL 33* 03/22/2015 0347   CHOLHDL 3.8 03/22/2015 0347   VLDL 40 03/22/2015 0347   LDLCALC 54 03/22/2015 0347     ASSESSMENT:    1. Possible unstable angina pectoris (HCC)   2. Coronary artery disease involving native coronary artery of native heart with unstable angina pectoris (HCC)   3. Cardiomyopathy, ischemic-EF 35-40%   4. Neurocardiogenic syncope   5. Pacemaker - Medtronic adaptadual-chamber implanted 2004, generator change 2014   6. Dyslipidemia   7. Tobacco abuse   8. Type 2 diabetes mellitus with other circulatory complications (HCC)      PLAN:  In order of problems listed above:  1. Possible unstable angina: His episode of chest pain occurred at rest and worse responsive to nitroglycerin, but was rather lengthy in duration. It is concerning for recurrent  coronary problems. He is in the time window for possible restenosis. He reports compliance with dual antiplatelet therapy. No new complaints in the last week and I think it is appropriate for him to have a nuclear perfusion study. 2. CAD roughly 5 months s/p drug-eluting stent to mid LAD artery in the setting of STEMI. He reports he has been compliant with dual antiplatelet therapy. Consider in-stent restenosis. He is on a reasonably high dose of carvedilol but is not taking any other antianginals. 3. CMP: There was substantial reduction in left ventricular ejection fraction immediately following his STEMI. Unclear how much of this was permanent injury and how much stunning. Will reevaluate LVEF on his nuclear stress test 4. Neurally mediated syncope: No recurrence since pacemaker implantation 5. PPM: Normal device function. Infrequent pacing is limited to sudden bradycardia response episodes. Continue remote downloads every 3 months and office visits yearly 6. HLP: LDL cholesterol levels were at target when last checked. Poorly controlled diabetes mellitus, he really needs to focus on improved diet and compliance with medication 7. Tobacco abuse: Discussed smoking cessation at length. I'm not sure whether his psychiatric issues will allow him to successfully quit smoking without a lot of support. 8. DM: A1c 13% which per his report is an improvement. Increase his likelihood of in-stent restenosis and the likelihood of progression of coronary disease.  Bipolar disorder: Note that he is taking several medications that could prolong the QT interval. Cautious monitoring of and avoid any other drugs that can cause similar QT prolongation.   Medication Adjustments/Labs and Tests Ordered: Current medicines are reviewed at length with the patient today.  Concerns regarding medicines are outlined above.  Medication changes, Labs and Tests ordered today are listed in the Patient Instructions below. Patient  Instructions  Your physician has requested that you have a lexiscan myoview AT [facility]. For further information please visit [email or web address] Please follow instruction sheet, as given.  Dr. Royann Shivers recommends that you schedule a follow-up appointment in: 6 MONTHS  Frank Bimler, MD  08/26/2015 9:40 AM    [facility] [address] [place], [place], [place]  [address] Phone: [phone]; Fax: [phone]

## 2015-08-27 ENCOUNTER — Other Ambulatory Visit: Payer: Self-pay | Admitting: *Deleted

## 2015-08-27 DIAGNOSIS — R079 Chest pain, unspecified: Secondary | ICD-10-CM

## 2015-08-28 ENCOUNTER — Telehealth: Payer: Self-pay | Admitting: *Deleted

## 2015-08-28 DIAGNOSIS — R079 Chest pain, unspecified: Secondary | ICD-10-CM

## 2015-08-28 NOTE — Telephone Encounter (Signed)
Order re-entered for Myoview with corrected code.

## 2015-08-31 ENCOUNTER — Encounter (HOSPITAL_COMMUNITY): Payer: Medicare Other

## 2015-08-31 ENCOUNTER — Inpatient Hospital Stay (HOSPITAL_COMMUNITY): Admission: RE | Admit: 2015-08-31 | Payer: Medicare Other | Source: Ambulatory Visit

## 2015-09-03 ENCOUNTER — Encounter (HOSPITAL_COMMUNITY): Payer: Medicare Other

## 2015-09-03 ENCOUNTER — Ambulatory Visit (HOSPITAL_COMMUNITY): Payer: Medicare Other

## 2015-09-03 ENCOUNTER — Inpatient Hospital Stay (HOSPITAL_COMMUNITY): Admission: RE | Admit: 2015-09-03 | Payer: Medicare Other | Source: Ambulatory Visit

## 2015-09-05 ENCOUNTER — Emergency Department (HOSPITAL_COMMUNITY): Payer: Medicare Other

## 2015-09-05 ENCOUNTER — Encounter (HOSPITAL_COMMUNITY): Payer: Self-pay | Admitting: Emergency Medicine

## 2015-09-05 ENCOUNTER — Emergency Department (HOSPITAL_COMMUNITY)
Admission: EM | Admit: 2015-09-05 | Discharge: 2015-09-05 | Disposition: A | Discharge status: Home / Self Care | Payer: Medicare Other | Attending: Emergency Medicine | Admitting: Emergency Medicine

## 2015-09-05 DIAGNOSIS — J0100 Acute maxillary sinusitis, unspecified: Secondary | ICD-10-CM | POA: Diagnosis not present

## 2015-09-05 DIAGNOSIS — E781 Pure hyperglyceridemia: Secondary | ICD-10-CM | POA: Diagnosis not present

## 2015-09-05 DIAGNOSIS — R05 Cough: Secondary | ICD-10-CM | POA: Diagnosis present

## 2015-09-05 DIAGNOSIS — I252 Old myocardial infarction: Secondary | ICD-10-CM | POA: Insufficient documentation

## 2015-09-05 DIAGNOSIS — I4891 Unspecified atrial fibrillation: Secondary | ICD-10-CM | POA: Insufficient documentation

## 2015-09-05 DIAGNOSIS — E119 Type 2 diabetes mellitus without complications: Secondary | ICD-10-CM | POA: Diagnosis not present

## 2015-09-05 DIAGNOSIS — Z79899 Other long term (current) drug therapy: Secondary | ICD-10-CM | POA: Diagnosis not present

## 2015-09-05 DIAGNOSIS — J069 Acute upper respiratory infection, unspecified: Secondary | ICD-10-CM | POA: Insufficient documentation

## 2015-09-05 DIAGNOSIS — Z950 Presence of cardiac pacemaker: Secondary | ICD-10-CM | POA: Insufficient documentation

## 2015-09-05 DIAGNOSIS — Z794 Long term (current) use of insulin: Secondary | ICD-10-CM | POA: Insufficient documentation

## 2015-09-05 DIAGNOSIS — Z6834 Body mass index (BMI) 34.0-34.9, adult: Secondary | ICD-10-CM | POA: Diagnosis not present

## 2015-09-05 DIAGNOSIS — F17210 Nicotine dependence, cigarettes, uncomplicated: Secondary | ICD-10-CM | POA: Diagnosis not present

## 2015-09-05 DIAGNOSIS — F319 Bipolar disorder, unspecified: Secondary | ICD-10-CM | POA: Diagnosis not present

## 2015-09-05 DIAGNOSIS — E669 Obesity, unspecified: Secondary | ICD-10-CM | POA: Insufficient documentation

## 2015-09-05 MED ORDER — AMOXICILLIN 500 MG PO CAPS: 500.0000 mg | ORAL_CAPSULE | Freq: Three times a day (TID) | ORAL | Status: DC

## 2015-09-05 NOTE — ED Provider Notes (Signed)
CSN: [id number]     Arrival date & time 09/05/15  0768 History   First MD Initiated Contact with Patient 09/05/15 [phone]     Chief Complaint  Patient presents with  . Nasal Congestion     (Consider location/radiation/quality/duration/timing/severity/associated sxs/prior Treatment) Patient is a 55 y.o. male presenting with cough. The history is provided by the patient. No language interpreter was used.  Cough Cough characteristics:  Productive Sputum characteristics:  Nondescript Severity:  Moderate Onset quality:  Gradual Duration:  1 week Timing:  Constant Chronicity:  New Smoker: no   Context: upper respiratory infection   Relieved by:  Nothing Worsened by:  Nothing tried Ineffective treatments:  None tried Associated symptoms: no sinus congestion and no sore throat   Pt complains of a congestion and a cough.   Pt reports he feels like he has a sinus infection  Past Medical History  Diagnosis Date  . Bipolar 1 disorder (HCC)   . Multiple personality   . Atrial fib/flutter, transient   . Myocardial infarct (HCC) 03/21/2015    DES LAD  . Pacemaker 06/02/2013  . DM2 (diabetes mellitus, type 2) (HCC) 06/02/2013  . Neurocardiogenic syncope 06/02/2013  . Normal coronary arteries    . Obesity (BMI 30.0-34.9) 06/02/2013  . Hypertriglyceridemia 06/02/2013   Past Surgical History  Procedure Laterality Date  . Pacemaker insertion Left 02/03/2003    Medtronic  . Nm myocar perf wall motion  10/06/2011    No ischemia  . Cardiac catheterization  09/26/2002    normal coronary arteries and LV  . Permanent pacemaker generator change N/A 09/28/2012    Procedure: PERMANENT PACEMAKER GENERATOR CHANGE;  Surgeon: Thurmon Fair, MD; Medtronic   . Cardiac catheterization N/A 03/21/2015    Procedure: Left Heart Cath and Coronary Angiography;  Surgeon: Lennette Bihari, MD; mLAD 100%, dLAD 80%, D2 80%, EF 50-55%  . Cardiac catheterization N/A 03/21/2015    Procedure: Coronary Stent Intervention;   Surgeon: Lennette Bihari, MD; 3.538 mm Xience Alpine DES to the LAD    Family History  Problem Relation Age of Onset  . Coronary artery disease Father   . Diabetes Mother   . Diabetes Father    Social History  Substance Use Topics  . Smoking status: Current Every Day Smoker -- 1.00 packs/day    Types: Cigarettes  . Smokeless tobacco: None  . Alcohol Use: No    Review of Systems  HENT: Negative for sore throat.   Respiratory: Positive for cough.   All other systems reviewed and are negative.     Allergies  Shellfish allergy  Home Medications   Prior to Admission medications   Medication Sig Start Date End Date Taking? Authorizing Provider  albuterol (PROVENTIL HFA;VENTOLIN HFA) 108 (90 BASE) MCG/ACT inhaler Inhale 1-2 puffs into the lungs every 6 (six) hours as needed for wheezing or shortness of breath.    Historical Provider, MD  ALPRAZolam Prudy Feeler) 1 MG tablet Take 1 mg by mouth See admin instructions. Take 1 tablet 4 times daily and 2 tablets at bedtime as needed    Historical Provider, MD  aspirin EC 81 MG EC tablet Take 1 tablet (81 mg total) by mouth daily. 03/23/15   Dwana Melena, PA-C  atorvastatin (LIPITOR) 80 MG tablet Take 1 tablet (80 mg total) by mouth daily at 6 PM. 03/23/15   Dwana Melena, PA-C  benazepril (LOTENSIN) 5 MG tablet Take 5 mg by mouth daily.    Historical Provider, MD  carvedilol (  COREG) 12.5 MG tablet Take 1 tablet (12.5 mg total) by mouth 2 (two) times daily with a meal. 03/23/15   Dwana Melena, PA-C  gabapentin (NEURONTIN) 300 MG capsule Take 300-600 mg by mouth 3 (three) times daily. Takes 1 capsule in am and at lunch and 2 capsules at night    Historical Provider, MD  gemfibrozil (LOPID) 600 MG tablet Take 600 mg by mouth 2 (two) times daily before a meal.    Historical Provider, MD  glipiZIDE-metformin (METAGLIP) 5-500 MG per tablet Take 1 tablet by mouth 2 (two) times daily before a meal.    Historical Provider, MD  HUMALOG KWIKPEN 100 UNIT/ML  KiwkPen Inject 30 Units as directed daily. 08/21/15   Historical Provider, MD  HYDROcodone-acetaminophen (NORCO) 7.5-325 MG per tablet Take 1 tablet by mouth 3 (three) times daily. 02/26/15   Historical Provider, MD  INVOKANA 100 MG TABS tablet Take 1 tablet by mouth daily. 08/21/15   Historical Provider, MD  LEVEMIR FLEXTOUCH 100 UNIT/ML Pen Inject 20 Units into the skin daily. 04/07/15   Historical Provider, MD  nitroGLYCERIN (NITROSTAT) 0.4 MG SL tablet Place 1 tablet (0.4 mg total) under the tongue every 5 (five) minutes x 3 doses as needed for chest pain. 03/23/15   Dwana Melena, PA-C  QUEtiapine (SEROQUEL XR) 300 MG 24 hr tablet Take 600 mg by mouth at bedtime.    Historical Provider, MD  ranitidine (ZANTAC) 150 MG tablet Take 1 tablet by mouth 2 (two) times daily as needed. 08/21/15   Historical Provider, MD  ticagrelor (BRILINTA) 90 MG TABS tablet Take 1 tablet (90 mg total) by mouth 2 (two) times daily. 03/23/15   Dwana Melena, PA-C  tiotropium (SPIRIVA) 18 MCG inhalation capsule Place 18 mcg into inhaler and inhale daily.    Historical Provider, MD  traZODone (DESYREL) 150 MG tablet Take 150-300 mg by mouth at bedtime as needed for sleep.     Historical Provider, MD  ziprasidone (GEODON) 60 MG capsule Take 60 mg by mouth 2 (two) times daily with a meal.     Historical Provider, MD   BP 130/76 mmHg  Pulse 81  Temp(Src) 98 F (36.7 C) (Oral)  Resp 14  Ht  (1.778 m)  Wt 86.637 kg  BMI 27.41 kg/m2  SpO2 100% Physical Exam  Constitutional: He appears well-developed and well-nourished.  HENT:  Head: Normocephalic.  Right Ear: External ear normal.  Left Ear: External ear normal.  Nose: Nose normal.  Mouth/Throat: Oropharynx is clear and moist.  Eyes: Pupils are equal, round, and reactive to light.  Neck: Normal range of motion.  Cardiovascular: Normal rate.   Pulmonary/Chest: Effort normal.  Abdominal: Soft.  Musculoskeletal: Normal range of motion.  Neurological: He is alert.   Skin: Skin is warm.  Nursing note and vitals reviewed.   ED Course  Procedures (including critical care time) Labs Review Labs Reviewed - No data to display  Imaging Review Dg Chest 2 View  09/05/2015  CLINICAL DATA:  Cough.  Upper respiratory symptoms for 2-3 days. EXAM: CHEST  2 VIEW COMPARISON:  07/19/2014 FINDINGS: Cardiac silhouette is normal in size and configuration. Normal mediastinal and hilar contours. Left anterior chest wall sequential pacemaker is stable and well positioned. Lungs are clear.  No pleural effusion or pneumothorax. Bony thorax is intact. IMPRESSION: No active cardiopulmonary disease. Electronically Signed   By: Amie Portland M.D.   On: 09/05/2015 10:19   I have personally reviewed and evaluated these  images and lab results as part of my medical decision-making.   EKG Interpretation None      MDM  Pt may have a sinus infection, could be allegies.  Pt advised zyrtec or claritin   Final diagnoses:  Acute maxillary sinusitis, recurrence not specified    Meds ordered this encounter  Medications  . amoxicillin (AMOXIL) 500 MG capsule    Sig: Take 1 capsule (500 mg total) by mouth 3 (three) times daily.    Dispense:  30 capsule    Refill:  0    Order Specific Question:  Supervising Provider    Answer:  Eber Hong [[id number]]  An After Visit Summary was printed and given to the patient.  Lonia Skinner Penermon, PA-C 09/05/15 1037  Eber Hong, MD 09/05/15 1534

## 2015-09-05 NOTE — ED Notes (Signed)
States sore throat and nasal congestion x 2 days. No PTA treamtents.

## 2015-09-05 NOTE — ED Notes (Signed)
Pt made aware to return if symptoms worsen or if any life threatening symptoms occur.   

## 2015-09-05 NOTE — Discharge Instructions (Signed)

## 2015-09-21 ENCOUNTER — Encounter (HOSPITAL_COMMUNITY): Payer: Medicare Other

## 2015-09-21 ENCOUNTER — Inpatient Hospital Stay (HOSPITAL_COMMUNITY): Admission: RE | Admit: 2015-09-21 | Payer: Medicare Other | Source: Ambulatory Visit

## 2015-09-29 DIAGNOSIS — E1159 Type 2 diabetes mellitus with other circulatory complications: Secondary | ICD-10-CM | POA: Diagnosis not present

## 2015-09-29 DIAGNOSIS — Z125 Encounter for screening for malignant neoplasm of prostate: Secondary | ICD-10-CM | POA: Diagnosis not present

## 2015-09-29 DIAGNOSIS — I10 Essential (primary) hypertension: Secondary | ICD-10-CM | POA: Diagnosis not present

## 2015-09-29 DIAGNOSIS — Z0000 Encounter for general adult medical examination without abnormal findings: Secondary | ICD-10-CM | POA: Diagnosis not present

## 2015-09-29 DIAGNOSIS — Z1389 Encounter for screening for other disorder: Secondary | ICD-10-CM | POA: Diagnosis not present

## 2015-10-27 DIAGNOSIS — T788XXA Other adverse effects, not elsewhere classified, initial encounter: Secondary | ICD-10-CM | POA: Diagnosis not present

## 2015-10-27 DIAGNOSIS — E1142 Type 2 diabetes mellitus with diabetic polyneuropathy: Secondary | ICD-10-CM | POA: Diagnosis not present

## 2015-11-11 DIAGNOSIS — Z5181 Encounter for therapeutic drug level monitoring: Secondary | ICD-10-CM | POA: Diagnosis not present

## 2015-11-11 DIAGNOSIS — M545 Low back pain: Secondary | ICD-10-CM | POA: Diagnosis not present

## 2015-12-16 ENCOUNTER — Telehealth: Payer: Self-pay | Admitting: Cardiology

## 2015-12-16 ENCOUNTER — Encounter: Payer: Medicare Other | Admitting: *Deleted

## 2015-12-16 ENCOUNTER — Telehealth: Payer: Self-pay | Admitting: *Deleted

## 2015-12-16 NOTE — Telephone Encounter (Signed)
Spoke with pt and reminded pt of remote transmission that is due today. Pt verbalized understanding.   

## 2015-12-16 NOTE — Telephone Encounter (Signed)
Britta Mccreedy calling to remind patient to send transmission. He says he does not have a monitor at home. He told Britta Mccreedy that he was working outside and got dizzy and fell down. I spoke with the patient- he reports that he was out in the heat when he got dizzy and fell. He says he also has diabetes. He has checked his sugar and he reports that his readings are stable. He does not have equipment at home to check BP. He says that later today he was on the couch and got up quickly to get a drink from the kitchen and got dizzy. I advised him to push oral fluids, avoid soft drinks and coffee and to monitor his glucose readings closely. I told him that if he fell again that he needs to go to the ER and not drive himself- he verbalizes understanding. I made him an appt on Monday 12/21/15 in [place] for PPM check- he is agreeable.

## 2015-12-17 ENCOUNTER — Emergency Department (HOSPITAL_COMMUNITY)
Admission: EM | Admit: 2015-12-17 | Discharge: 2015-12-17 | Disposition: A | Discharge status: Home / Self Care | Payer: Medicare Other | Attending: Emergency Medicine | Admitting: Emergency Medicine

## 2015-12-17 ENCOUNTER — Emergency Department (HOSPITAL_COMMUNITY): Payer: Medicare Other

## 2015-12-17 ENCOUNTER — Encounter (HOSPITAL_COMMUNITY): Payer: Self-pay | Admitting: Emergency Medicine

## 2015-12-17 DIAGNOSIS — Z6834 Body mass index (BMI) 34.0-34.9, adult: Secondary | ICD-10-CM | POA: Insufficient documentation

## 2015-12-17 DIAGNOSIS — Z79899 Other long term (current) drug therapy: Secondary | ICD-10-CM | POA: Diagnosis not present

## 2015-12-17 DIAGNOSIS — E119 Type 2 diabetes mellitus without complications: Secondary | ICD-10-CM | POA: Diagnosis not present

## 2015-12-17 DIAGNOSIS — H81399 Other peripheral vertigo, unspecified ear: Secondary | ICD-10-CM | POA: Insufficient documentation

## 2015-12-17 DIAGNOSIS — R531 Weakness: Secondary | ICD-10-CM | POA: Diagnosis not present

## 2015-12-17 DIAGNOSIS — I252 Old myocardial infarction: Secondary | ICD-10-CM | POA: Insufficient documentation

## 2015-12-17 DIAGNOSIS — I2510 Atherosclerotic heart disease of native coronary artery without angina pectoris: Secondary | ICD-10-CM | POA: Diagnosis not present

## 2015-12-17 DIAGNOSIS — F17210 Nicotine dependence, cigarettes, uncomplicated: Secondary | ICD-10-CM | POA: Insufficient documentation

## 2015-12-17 DIAGNOSIS — F319 Bipolar disorder, unspecified: Secondary | ICD-10-CM | POA: Diagnosis not present

## 2015-12-17 DIAGNOSIS — I951 Orthostatic hypotension: Secondary | ICD-10-CM | POA: Diagnosis not present

## 2015-12-17 DIAGNOSIS — Z79891 Long term (current) use of opiate analgesic: Secondary | ICD-10-CM | POA: Insufficient documentation

## 2015-12-17 DIAGNOSIS — R42 Dizziness and giddiness: Secondary | ICD-10-CM | POA: Diagnosis present

## 2015-12-17 DIAGNOSIS — J449 Chronic obstructive pulmonary disease, unspecified: Secondary | ICD-10-CM | POA: Diagnosis not present

## 2015-12-17 DIAGNOSIS — E669 Obesity, unspecified: Secondary | ICD-10-CM | POA: Insufficient documentation

## 2015-12-17 DIAGNOSIS — Z7982 Long term (current) use of aspirin: Secondary | ICD-10-CM | POA: Diagnosis not present

## 2015-12-17 DIAGNOSIS — R739 Hyperglycemia, unspecified: Secondary | ICD-10-CM | POA: Diagnosis not present

## 2015-12-17 DIAGNOSIS — R404 Transient alteration of awareness: Secondary | ICD-10-CM | POA: Diagnosis not present

## 2015-12-17 DIAGNOSIS — S0990XA Unspecified injury of head, initial encounter: Secondary | ICD-10-CM | POA: Diagnosis not present

## 2015-12-17 HISTORY — DX: Atherosclerotic heart disease of native coronary artery without angina pectoris: I25.10

## 2015-12-17 HISTORY — DX: Gastro-esophageal reflux disease without esophagitis: K21.9

## 2015-12-17 HISTORY — DX: Chronic obstructive pulmonary disease, unspecified: J44.9

## 2015-12-17 LAB — URINALYSIS, ROUTINE W REFLEX MICROSCOPIC
Bilirubin Urine: NEGATIVE
Glucose, UA: >1000 mg/dL — AB
Hgb urine dipstick: NEGATIVE
Ketones, ur: NEGATIVE mg/dL
Leukocytes, UA: NEGATIVE
Nitrite: NEGATIVE
Protein, ur: NEGATIVE mg/dL
Specific Gravity, Urine: <1.005 — ABNORMAL LOW (ref 1.005–1.030)
pH: 6 (ref 5.0–8.0)

## 2015-12-17 LAB — CBC
HCT: 41.2 % (ref 39.0–52.0)
Hemoglobin: 14.7 g/dL (ref 13.0–17.0)
MCH: 31.4 pg (ref 26.0–34.0)
MCHC: 35.7 g/dL (ref 30.0–36.0)
MCV: 88 fL (ref 78.0–100.0)
Platelets: 474 10*3/uL — ABNORMAL HIGH (ref 150–400)
RBC: 4.68 MIL/uL (ref 4.22–5.81)
RDW: 13.3 % (ref 11.5–15.5)
WBC: 18.2 10*3/uL — ABNORMAL HIGH (ref 4.0–10.5)

## 2015-12-17 LAB — URINE MICROSCOPIC-ADD ON
Bacteria, UA: NONE SEEN
RBC / HPF: NONE SEEN RBC/hpf (ref 0–5)
WBC, UA: NONE SEEN WBC/hpf (ref 0–5)

## 2015-12-17 LAB — CBG MONITORING, ED: Glucose-Capillary: 276 mg/dL — ABNORMAL HIGH (ref 65–99)

## 2015-12-17 LAB — BASIC METABOLIC PANEL
Anion gap: 12 (ref 5–15)
BUN: <5 mg/dL — ABNORMAL LOW (ref 6–20)
CO2: 29 mmol/L (ref 22–32)
Calcium: 8.5 mg/dL — ABNORMAL LOW (ref 8.9–10.3)
Chloride: 88 mmol/L — ABNORMAL LOW (ref 101–111)
Creatinine, Ser: 0.78 mg/dL (ref 0.61–1.24)
GFR calc Af Amer: >60 mL/min (ref >60)
GFR calc non Af Amer: >60 mL/min (ref >60)
Glucose, Bld: 252 mg/dL — ABNORMAL HIGH (ref 65–99)
Potassium: 2.4 mmol/L — CL (ref 3.5–5.1)
Sodium: 129 mmol/L — ABNORMAL LOW (ref 135–145)

## 2015-12-17 LAB — TROPONIN I: Troponin I: <0.03 ng/mL (ref <0.031)

## 2015-12-17 LAB — D-DIMER, QUANTITATIVE (NOT AT ARMC): D-Dimer, Quant: 0.44 ug/mL-FEU (ref 0.00–0.50)

## 2015-12-17 MED ORDER — IPRATROPIUM-ALBUTEROL 0.5-2.5 (3) MG/3ML IN SOLN
3.0000 mL | Freq: Once | RESPIRATORY_TRACT | Status: AC
  Administered 2015-12-17: 3 mL via RESPIRATORY_TRACT
  Filled 2015-12-17: qty 3

## 2015-12-17 MED ORDER — POTASSIUM CHLORIDE 10 MEQ/100ML IV SOLN
10.0000 meq | Freq: Once | INTRAVENOUS | Status: AC
  Administered 2015-12-17: 10 meq via INTRAVENOUS
  Filled 2015-12-17: qty 100

## 2015-12-17 MED ORDER — POTASSIUM CHLORIDE CRYS ER 20 MEQ PO TBCR
40.0000 meq | EXTENDED_RELEASE_TABLET | Freq: Once | ORAL | Status: AC
  Administered 2015-12-17: 40 meq via ORAL
  Filled 2015-12-17: qty 2

## 2015-12-17 MED ORDER — SODIUM CHLORIDE 0.9 % IV SOLN
INTRAVENOUS | Status: DC
  Administered 2015-12-17: 15:00:00 via INTRAVENOUS

## 2015-12-17 MED ORDER — MECLIZINE HCL 25 MG PO TABS: 25.0000 mg | ORAL_TABLET | Freq: Three times a day (TID) | ORAL | Status: DC | PRN

## 2015-12-17 MED ORDER — MECLIZINE HCL 12.5 MG PO TABS
25.0000 mg | ORAL_TABLET | Freq: Once | ORAL | Status: AC
  Administered 2015-12-17: 25 mg via ORAL
  Filled 2015-12-17: qty 2

## 2015-12-17 MED ORDER — SODIUM CHLORIDE 0.9 % IV BOLUS (SEPSIS)
500.0000 mL | Freq: Once | INTRAVENOUS | Status: AC
  Administered 2015-12-17: 500 mL via INTRAVENOUS

## 2015-12-17 NOTE — Discharge Instructions (Signed)
Take the prescription as directed.  Increase your fluid intake (ie:  Gatoraide) for the next few days, especially if you are outside in the heat. Call your regular medical doctor today to schedule a follow up appointment this week. Call the ENT doctor and your Cardiologist today to schedule a follow up appointment within the next week. Return to the Emergency Department immediately sooner if worsening.

## 2015-12-17 NOTE — ED Provider Notes (Signed)
CSN: [id number]     Arrival date & time 12/17/15  1147 History   First MD Initiated Contact with Patient 12/17/15 1210     Chief Complaint  Patient presents with  . Weakness     HPI Pt was seen at 1210.  Per pt, c/o gradual onset and persistence of constant generalized weakness for the past 2 months. Pt also c/o intermittent "dizziness" for the past 2 months. Pt describes the dizziness as "everything is moving around" and "spinning." Pt also states he "felt lightheaded" yesterday, "my knees buckled under me" and "I fell down." Pt states this occurred while he was "outside in the heat." States he feels that lightheadedness occurs when he moved from sitting to standing "too fast." Denies CP/palpitations, no SOB/cough, no abd pain, no N/V/D, no headache, no visual changes, no focal motor weakness, no tingling/numbness in extremities, no ataxia, no slurred speech, no facial droop.     Past Medical History  Diagnosis Date  . Bipolar 1 disorder (HCC)   . Multiple personality   . Atrial fib/flutter, transient   . Myocardial infarct (HCC) 03/21/2015    DES LAD  . Pacemaker 06/02/2013  . DM2 (diabetes mellitus, type 2) (HCC) 06/02/2013  . Neurocardiogenic syncope 06/02/2013  . Obesity (BMI 30.0-34.9) 06/02/2013  . Hypertriglyceridemia 06/02/2013  . COPD (chronic obstructive pulmonary disease) (HCC)   . GERD (gastroesophageal reflux disease)   . Coronary artery disease    Past Surgical History  Procedure Laterality Date  . Pacemaker insertion Left 02/03/2003    [employer]  . Nm myocar perf wall motion  10/06/2011    No ischemia  . Cardiac catheterization  09/26/2002    normal coronary arteries and LV  . Permanent pacemaker generator change N/A 09/28/2012    Procedure: PERMANENT PACEMAKER GENERATOR CHANGE;  Surgeon: Thurmon Fair, MD; [employer]   . Cardiac catheterization N/A 03/21/2015    Procedure: Left Heart Cath and Coronary Angiography;  Surgeon: Lennette Bihari, MD; mLAD 100%, dLAD 80%, D2  80%, EF 50-55%  . Cardiac catheterization N/A 03/21/2015    Procedure: Coronary Stent Intervention;  Surgeon: Lennette Bihari, MD; 3.538 mm Xience Alpine DES to the LAD    Family History  Problem Relation Age of Onset  . Coronary artery disease Father   . Diabetes Mother   . Diabetes Father    Social History  Substance Use Topics  . Smoking status: Current Every Day Smoker -- 1.00 packs/day    Types: Cigarettes  . Smokeless tobacco: None  . Alcohol Use: No    Review of Systems ROS: Statement: All systems negative except as marked or noted in the HPI; Constitutional: Negative for fever and chills. ; ; Eyes: Negative for eye pain, redness and discharge. ; ; ENMT: Negative for ear pain, hoarseness, nasal congestion, sinus pressure and sore throat. ; ; Cardiovascular: Negative for chest pain, palpitations, diaphoresis, dyspnea and peripheral edema. ; ; Respiratory: Negative for cough, wheezing and stridor. ; ; Gastrointestinal: Negative for nausea, vomiting, diarrhea, abdominal pain, blood in stool, hematemesis, jaundice and rectal bleeding. . ; ; Genitourinary: Negative for dysuria, flank pain and hematuria. ; ; Musculoskeletal: Negative for back pain and neck pain. Negative for swelling and trauma.; ; Skin: Negative for pruritus, rash, abrasions, blisters, bruising and skin lesion.; ; Neuro: +lightheadedness, "spinning," generalized weakness. Negative for headache and neck stiffness. Negative for altered level of consciousness, altered mental status, extremity weakness, paresthesias, involuntary movement, seizure and syncope.      Allergies  Shellfish allergy  Home Medications   Prior to Admission medications   Medication Sig Start Date End Date Taking? Authorizing Provider  albuterol (PROVENTIL HFA;VENTOLIN HFA) 108 (90 BASE) MCG/ACT inhaler Inhale 1-2 puffs into the lungs every 6 (six) hours as needed for wheezing or shortness of breath.   Yes Historical Provider, MD  ALPRAZolam Prudy Feeler)  1 MG tablet Take 1 mg by mouth See admin instructions. Take 1 tablet 4 times daily and 2 tablets at bedtime as needed   Yes Historical Provider, MD  aspirin EC 81 MG EC tablet Take 1 tablet (81 mg total) by mouth daily. 03/23/15  Yes Dwana Melena, PA-C  atorvastatin (LIPITOR) 80 MG tablet Take 1 tablet (80 mg total) by mouth daily at 6 PM. 03/23/15  Yes Dwana Melena, PA-C  benazepril (LOTENSIN) 5 MG tablet Take 5 mg by mouth daily.   Yes Historical Provider, MD  carvedilol (COREG) 12.5 MG tablet Take 1 tablet (12.5 mg total) by mouth 2 (two) times daily with a meal. 03/23/15  Yes Dwana Melena, PA-C  gabapentin (NEURONTIN) 300 MG capsule Take 300-600 mg by mouth 3 (three) times daily. Takes 1 capsule in am and at lunch and 2 capsules at night   Yes Historical Provider, MD  gemfibrozil (LOPID) 600 MG tablet Take 600 mg by mouth 2 (two) times daily before a meal.   Yes Historical Provider, MD  HUMALOG KWIKPEN 100 UNIT/ML KiwkPen Inject 30 Units as directed daily. 08/21/15  Yes Historical Provider, MD  HYDROcodone-acetaminophen (NORCO) 7.5-325 MG per tablet Take 1 tablet by mouth 3 (three) times daily. 02/26/15  Yes Historical Provider, MD  INVOKANA 100 MG TABS tablet Take 1 tablet by mouth daily. 08/21/15  Yes Historical Provider, MD  LEVEMIR FLEXTOUCH 100 UNIT/ML Pen Inject 20 Units into the skin daily. 04/07/15  Yes Historical Provider, MD  nitroGLYCERIN (NITROSTAT) 0.4 MG SL tablet Place 1 tablet (0.4 mg total) under the tongue every 5 (five) minutes x 3 doses as needed for chest pain. 03/23/15  Yes Dwana Melena, PA-C  QUEtiapine (SEROQUEL XR) 300 MG 24 hr tablet Take 600 mg by mouth at bedtime.   Yes Historical Provider, MD  ranitidine (ZANTAC) 150 MG tablet Take 1 tablet by mouth 2 (two) times daily as needed. 08/21/15  Yes Historical Provider, MD  ticagrelor (BRILINTA) 90 MG TABS tablet Take 1 tablet (90 mg total) by mouth 2 (two) times daily. 03/23/15  Yes Dwana Melena, PA-C  tiotropium (SPIRIVA) 18 MCG  inhalation capsule Place 18 mcg into inhaler and inhale daily.   Yes Historical Provider, MD  traZODone (DESYREL) 150 MG tablet Take 150-300 mg by mouth at bedtime as needed for sleep.    Yes Historical Provider, MD  ziprasidone (GEODON) 60 MG capsule Take 60 mg by mouth 2 (two) times daily with a meal.    Yes Historical Provider, MD  amoxicillin (AMOXIL) 500 MG capsule Take 1 capsule (500 mg total) by mouth 3 (three) times daily. Patient not taking: Reported on 12/17/2015 09/05/15   Elson Areas, PA-C   BP 150/78 mmHg  Pulse 84  Temp(Src) 97.6 F (36.4 C) (Oral)  Resp 20  SpO2 94%   12:30 Orthostatic Vital Signs DL  Orthostatic Lying  - BP- Lying: 110/77 mmHg ; Pulse- Lying: 83  Orthostatic Sitting - BP- Sitting: 106/83 mmHg ; Pulse- Sitting: 87  Orthostatic Standing at 0 minutes - BP- Standing at 0 minutes: 93/66 mmHg ; Pulse- Standing at 0 minutes:  89      Physical Exam  1215: Physical examination:  Nursing notes reviewed; Vital signs and O2 SAT reviewed;  Constitutional: Well developed, Well nourished, Well hydrated, In no acute distress; Head:  Normocephalic, atraumatic; Eyes: EOMI, PERRL, No scleral icterus; ENMT: TM's clear bilat. +edemetous nasal turbinates bilat with clear rhinorrhea. Mouth and pharynx normal, Mucous membranes moist; Neck: Supple, Full range of motion, No lymphadenopathy; Cardiovascular: Regular rate and rhythm, No gallop; Respiratory: Breath sounds diminished & equal bilaterally, faint scattered wheezes. No audible wheezing. +intermittently hyperventilating and pursed lip breathing during exam. Speaking full sentences with ease, Normal respiratory effort/excursion; Chest: Nontender, Movement normal; Abdomen: Soft, Nontender, Nondistended, Normal bowel sounds; Genitourinary: No CVA tenderness; Extremities: Pulses normal, No tenderness, No edema, No calf edema or asymmetry.; Neuro: AA&Ox3, Major CN grossly intact. Speech clear.  No facial droop.  +left horizontal end  gaze fatigable nystagmus which reproduces pt's symptoms. Grips equal. Strength 5/5 equal bilat UE's and LE's.  DTR 2/4 equal bilat UE's and LE's.  No gross sensory deficits.  Normal cerebellar testing bilat UE's (finger-nose) and LE's (heel-shin). Climbs on and off stretcher easily by himself. Gait steady..; Skin: Color normal, Warm, Dry.; Psych:  Anxious.    ED Course  Procedures (including critical care time) Labs Review   Imaging Review  I have personally reviewed and evaluated these images and lab results as part of my medical decision-making.   EKG Interpretation   Date/Time:  Thursday December 17 2015 11:49:32 EDT Ventricular Rate:  85 PR Interval:  166 QRS Duration: 93 QT Interval:  411 QTC Calculation: 489 R Axis:   41 Text Interpretation:  Sinus rhythm Anteroseptal infarct, old When compared  with ECG of 03/23/2015 Nonspecific ST and T wave abnormality is no longer  Present Confirmed by [facility]  MD, Nicholos Johns [id number]) on 12/17/2015 12:27:52 PM      MDM  MDM Reviewed: previous chart, nursing note and vitals Reviewed previous: labs and ECG Interpretation: labs, ECG, x-ray and CT scan   Results for orders placed or performed during the hospital encounter of 12/17/15  Basic metabolic panel  Result Value Ref Range   Sodium 129 (L) 135 - 145 mmol/L   Potassium 2.4 (LL) 3.5 - 5.1 mmol/L   Chloride 88 (L) 101 - 111 mmol/L   CO2 29 22 - 32 mmol/L   Glucose, Bld 252 (H) 65 - 99 mg/dL   BUN <5 (L) 6 - 20 mg/dL   Creatinine, Ser 8.29 0.61 - 1.24 mg/dL   Calcium 8.5 (L) 8.9 - 10.3 mg/dL   GFR calc non Af Amer >60 >60 mL/min   GFR calc Af Amer >60 >60 mL/min   Anion gap 12 5 - 15  CBC  Result Value Ref Range   WBC 18.2 (H) 4.0 - 10.5 K/uL   RBC 4.68 4.22 - 5.81 MIL/uL   Hemoglobin 14.7 13.0 - 17.0 g/dL   HCT 93.7 16.9 - 67.8 %   MCV 88.0 78.0 - 100.0 fL   MCH 31.4 26.0 - 34.0 pg   MCHC 35.7 30.0 - 36.0 g/dL   RDW 93.8 10.1 - 75.1 %   Platelets 474 (H) 150 - 400 K/uL   Urinalysis, Routine w reflex microscopic  Result Value Ref Range   Color, Urine YELLOW YELLOW   APPearance CLEAR CLEAR   Specific Gravity, Urine <1.005 (L) 1.005 - 1.030   pH 6.0 5.0 - 8.0   Glucose, UA >1000 (A) NEGATIVE mg/dL   Hgb urine dipstick NEGATIVE  NEGATIVE   Bilirubin Urine NEGATIVE NEGATIVE   Ketones, ur NEGATIVE NEGATIVE mg/dL   Protein, ur NEGATIVE NEGATIVE mg/dL   Nitrite NEGATIVE NEGATIVE   Leukocytes, UA NEGATIVE NEGATIVE  Troponin I  Result Value Ref Range   Troponin I <0.03 <0.031 ng/mL  D-dimer, quantitative  Result Value Ref Range   D-Dimer, Quant 0.44 0.00 - 0.50 ug/mL-FEU  Urine microscopic-add on  Result Value Ref Range   Squamous Epithelial / LPF 0-5 (A) NONE SEEN   WBC, UA NONE SEEN 0 - 5 WBC/hpf   RBC / HPF NONE SEEN 0 - 5 RBC/hpf   Bacteria, UA NONE SEEN NONE SEEN  CBG monitoring, ED  Result Value Ref Range   Glucose-Capillary 276 (H) 65 - 99 mg/dL   Comment 1 Notify RN    Dg Chest 2 View 12/17/2015  CLINICAL DATA:  Lightheadedness yesterday. Sensation of spinning for 2 months. No known injury. Initial encounter. EXAM: CHEST  2 VIEW COMPARISON:  PA and lateral chest 09/05/2015 and 09/25/2012. FINDINGS: Pacing device is unchanged. Lungs are clear. Heart size is normal. No pneumothorax or pleural effusion. IMPRESSION: No acute disease. Electronically Signed   By: Drusilla Kanner M.D.   On: 12/17/2015 13:33   Ct Head Wo Contrast 12/17/2015  CLINICAL DATA:  Patient status post fall 12/16/2015. No reported loss consciousness. EXAM: CT HEAD WITHOUT CONTRAST TECHNIQUE: Contiguous axial images were obtained from the base of the skull through the vertex without intravenous contrast. COMPARISON:  Brain CT 10/16/2003 FINDINGS: Ventricles and sulci are appropriate for patient's age. Old left basal ganglia lacunar infarct. No evidence for acute cortically based infarct, intracranial hemorrhage, mass lesion or mass-effect. Orbits are unremarkable. Paranasal sinuses are  well aerated. Mastoid air cells are unremarkable. Calvarium is intact. IMPRESSION: No acute intracranial process. Electronically Signed   By: Annia Belt M.D.   On: 12/17/2015 13:41       1415:  Na corrects to 133 for hyperglycemia. Pt not acidotic. Potassium repleted IV and PO. Has tol PO well while in the ED without N/V. Pt has ambulated with steady gait, easy resps, NAD. Feels "better" after neb tx, meds and IVF and wants to go home now. Pacer was interrogated; awaiting report.   1440:  T/C from [employer]: pt's pacer report is "perfect;" will fax hard copy.  Workup today reassuring. Tx symptomatically, f/u PMD and Cards MD. Dx and testing d/w pt.  Questions answered.  Verb understanding, agreeable to d/c home with outpt f/u.   Samuel Jester, DO 12/19/15 2023

## 2015-12-17 NOTE — ED Notes (Signed)
Patient ambulated - walked without any assistance.

## 2015-12-17 NOTE — ED Notes (Signed)
Patient with c/o generalized weakness x 2 months. Denies pain. C/o dizziness x 2 months as well. C/o fall yesterday after a dizzy spell. Patient arrives alert/oriented.

## 2015-12-17 NOTE — ED Notes (Signed)
Initial interrogation of pacemaker was a failure. Attempting to repeat interrogation.

## 2015-12-17 NOTE — ED Notes (Signed)
CRITICAL VALUE ALERT  Critical value received:  Potassium 2.4  Date of notification:  12/17/15  Time of notification:  1252  Critical value read back:Yes.    Nurse who received alert:  Viviano Simas, RN  MD notified (1st page):  Clarene Duke

## 2015-12-18 ENCOUNTER — Encounter: Payer: Self-pay | Admitting: Cardiology

## 2015-12-25 ENCOUNTER — Telehealth: Payer: Self-pay | Admitting: Cardiovascular Disease

## 2015-12-25 NOTE — Telephone Encounter (Signed)
New Message  Pt calling to speak w/ Devbice- received letter that he missed remote check- pt stated he does not have home monitor. Please call back and discuss.

## 2015-12-25 NOTE — Telephone Encounter (Signed)
Spoke w/ pt and he informed me that he doesn't have a home monitor. Instructed pt to disregard letter b/c he already had an appt scheduled w/ device clinic on 01-04-16. Pt verbalized understanding.

## 2016-01-07 DIAGNOSIS — M545 Low back pain: Secondary | ICD-10-CM | POA: Diagnosis not present

## 2016-01-07 DIAGNOSIS — I10 Essential (primary) hypertension: Secondary | ICD-10-CM | POA: Diagnosis not present

## 2016-01-07 DIAGNOSIS — E1142 Type 2 diabetes mellitus with diabetic polyneuropathy: Secondary | ICD-10-CM | POA: Diagnosis not present

## 2016-01-18 ENCOUNTER — Ambulatory Visit (INDEPENDENT_AMBULATORY_CARE_PROVIDER_SITE_OTHER): Payer: Medicare Other | Admitting: *Deleted

## 2016-01-18 DIAGNOSIS — Z950 Presence of cardiac pacemaker: Secondary | ICD-10-CM | POA: Diagnosis not present

## 2016-01-18 DIAGNOSIS — R55 Syncope and collapse: Secondary | ICD-10-CM | POA: Diagnosis not present

## 2016-01-18 LAB — CUP PACEART INCLINIC DEVICE CHECK
Battery Impedance: 181 Ohm
Battery Remaining Longevity: 140 mo
Battery Voltage: 2.79 V
Brady Statistic AP VP Percent: 0 %
Brady Statistic AP VS Percent: 0 %
Brady Statistic AS VP Percent: 0 %
Brady Statistic AS VS Percent: 99 %
Date Time Interrogation Session: 20170703153425
Implantable Lead Implant Date: 20040719
Implantable Lead Implant Date: 20040719
Implantable Lead Location: 753859
Implantable Lead Location: 753860
Implantable Lead Model: 5076
Implantable Lead Model: 5076
Lead Channel Impedance Value: 551 Ohm
Lead Channel Impedance Value: 572 Ohm
Lead Channel Pacing Threshold Amplitude: 0.5 V
Lead Channel Pacing Threshold Amplitude: 0.625 V
Lead Channel Pacing Threshold Amplitude: 1.25 V
Lead Channel Pacing Threshold Amplitude: 1.25 V
Lead Channel Pacing Threshold Pulse Width: 0.4 ms
Lead Channel Pacing Threshold Pulse Width: 0.4 ms
Lead Channel Pacing Threshold Pulse Width: 0.4 ms
Lead Channel Pacing Threshold Pulse Width: 0.4 ms
Lead Channel Sensing Intrinsic Amplitude: 11.2 mV
Lead Channel Sensing Intrinsic Amplitude: 4 mV
Lead Channel Setting Pacing Amplitude: 1.5 V
Lead Channel Setting Pacing Amplitude: 2.5 V
Lead Channel Setting Pacing Pulse Width: 0.4 ms
Lead Channel Setting Sensing Sensitivity: 5.6 mV

## 2016-01-18 NOTE — Progress Notes (Signed)
Pacemaker check in clinic. Normal device function. Thresholds, sensing, impedances consistent with previous measurements. Device programmed to maximize longevity. No mode switch or high ventricular rates noted. >254 rate drop episodes, no EGMs. Device programmed at appropriate safety margins. Histogram distribution appropriate for patient activity level. Device programmed to optimize intrinsic conduction. Estimated longevity 11.5 years. ROV with [facility] 04/13/16.

## 2016-02-22 ENCOUNTER — Telehealth: Payer: Self-pay | Admitting: Cardiovascular Disease

## 2016-02-22 MED ORDER — TICAGRELOR 90 MG PO TABS: 90.0000 mg | ORAL_TABLET | Freq: Two times a day (BID) | ORAL | 6 refills | Status: DC

## 2016-02-22 NOTE — Telephone Encounter (Signed)
Rx(s) sent to pharmacy electronically.  

## 2016-02-22 NOTE — Telephone Encounter (Signed)
New Message   *STAT* If patient is at the pharmacy, call can be transferred to refill team.   1. Which medications need to be refilled? (please list name of each medication and dose if known) Brilinta   2. Which pharmacy/location (including street and city if local pharmacy) is medication to be sent to? [facility] in [place]  3. Do they need a 30 day or 90 day supply? Pt states he does not know.

## 2016-02-22 NOTE — Telephone Encounter (Signed)
Please advise 

## 2016-02-23 ENCOUNTER — Other Ambulatory Visit: Payer: Self-pay | Admitting: *Deleted

## 2016-02-23 MED ORDER — ATORVASTATIN CALCIUM 80 MG PO TABS: 80.0000 mg | ORAL_TABLET | Freq: Every day | ORAL | 1 refills | Status: DC

## 2016-02-23 MED ORDER — TICAGRELOR 90 MG PO TABS: 90.0000 mg | ORAL_TABLET | Freq: Two times a day (BID) | ORAL | 1 refills | Status: DC

## 2016-02-23 MED ORDER — CARVEDILOL 12.5 MG PO TABS: 12.5000 mg | ORAL_TABLET | Freq: Two times a day (BID) | ORAL | 1 refills | Status: DC

## 2016-04-08 DIAGNOSIS — E1142 Type 2 diabetes mellitus with diabetic polyneuropathy: Secondary | ICD-10-CM | POA: Diagnosis not present

## 2016-04-08 DIAGNOSIS — I10 Essential (primary) hypertension: Secondary | ICD-10-CM | POA: Diagnosis not present

## 2016-04-08 DIAGNOSIS — M545 Low back pain: Secondary | ICD-10-CM | POA: Diagnosis not present

## 2016-04-13 ENCOUNTER — Ambulatory Visit (INDEPENDENT_AMBULATORY_CARE_PROVIDER_SITE_OTHER): Payer: Medicare Other | Admitting: Cardiovascular Disease

## 2016-04-13 ENCOUNTER — Encounter: Payer: Self-pay | Admitting: Cardiovascular Disease

## 2016-04-13 VITALS — BP 116/72 | HR 80 | Ht 70.0 in | Wt 202.4 lb

## 2016-04-13 DIAGNOSIS — E785 Hyperlipidemia, unspecified: Secondary | ICD-10-CM | POA: Diagnosis not present

## 2016-04-13 DIAGNOSIS — Z9861 Coronary angioplasty status: Secondary | ICD-10-CM

## 2016-04-13 DIAGNOSIS — I4581 Long QT syndrome: Secondary | ICD-10-CM | POA: Diagnosis not present

## 2016-04-13 DIAGNOSIS — Z950 Presence of cardiac pacemaker: Secondary | ICD-10-CM | POA: Diagnosis not present

## 2016-04-13 DIAGNOSIS — R9431 Abnormal electrocardiogram [ECG] [EKG]: Secondary | ICD-10-CM

## 2016-04-13 DIAGNOSIS — Z720 Tobacco use: Secondary | ICD-10-CM

## 2016-04-13 DIAGNOSIS — I2510 Atherosclerotic heart disease of native coronary artery without angina pectoris: Secondary | ICD-10-CM

## 2016-04-13 DIAGNOSIS — R55 Syncope and collapse: Secondary | ICD-10-CM

## 2016-04-13 DIAGNOSIS — E1159 Type 2 diabetes mellitus with other circulatory complications: Secondary | ICD-10-CM

## 2016-04-13 DIAGNOSIS — Z79899 Other long term (current) drug therapy: Secondary | ICD-10-CM

## 2016-04-13 NOTE — Patient Instructions (Signed)
Medication Instructions: Dr Royann Shivers has recommended making the following medication changes: 1. STOP Brilinta  Labwork: Your physician recommends that you return for lab work at your earliest convenience - FASTING.  Testing/Procedures: NONE ORDERED  Follow-up: Dr Royann Shivers recommends that you schedule a follow-up appointment in 1 year. You will receive a reminder letter in the mail two months in advance. If you don't receive a letter, please call our office to schedule the follow-up appointment.  If you need a refill on your cardiac medications before your next appointment, please call your pharmacy.

## 2016-04-13 NOTE — Progress Notes (Signed)
Patient ID: Frank Powell, male   DOB: [birth year], 55 y.o.   MRN: [id number]     Cardiology Office Note    Date:  04/13/2016   ID:  Frank Powell, DOB 1960/10/01, MRN [id number]  PCP:  Toma Deiters, MD  Cardiologist:   Thurmon Fair, MD   Chief Complaint  Patient presents with  . Follow-up    6 Months; dizziness when standing, causing pt to fall. pain in legs and feet occasioanally.    History of Present Illness:  Frank Powell is a 55 y.o. male with a history of premature onset coronary artery disease, status post ST segment elevation anterior wall myocardial infarction in September 2016 treated with placement of a drug-eluting stent in the mid LAD artery (3.538 mm Xience Alpine ). He continues to be a heavy smoker, has poorly controlled diabetes mellitus (last hemoglobin A1c 13%) has hyperlipidemia and a history of noncompliance (possibly related to bipolar disorder). Left ventricular ejection fraction was 35-40 % shortly following his infarction. He has not had any problems with shortness of breath at rest or with activity. He has occasional chest discomfort that occurs randomly, often when he is watching TV. It usually abates after 1 sublingual nitroglycerin, he rarely has to take 2 tablets. On average the episodes occur once every 2 weeks. Denies palpitations, syncope, leg edema and claudication. He has not had lower extremity edema, claudication or focal neurological complaints. He describes fairly significant orthostatic hypotension related dizziness..  His electrocardiogram today shows sinus rhythm with Q waves in the septal leads and nonspecific T-wave changes. QTC intervals prolonged at 482 ms, likely due to his psychotropic medications  He has a history of neurocardiogenic/cardioinhibitory syncope that has largely resolved following implantation of a dual-chamber permanent pacemaker in 2014. Device interrogation shows normal function. 12 years of generator longevity remain. He  only has 0.2% atrial pacing and 0.5% ventricular pacing, with a device set at a lower rate limit of 50 bpm. his pacemaker has not recorded high atrial rates are high ventricular rates. He does not have a land line and has been unable to transmit pacemaker downloads. He notes some difficulty with transportation and inquires about following up in [place].   Past Medical History:  Diagnosis Date  . Atrial fib/flutter, transient   . Bipolar 1 disorder (HCC)   . COPD (chronic obstructive pulmonary disease) (HCC)   . Coronary artery disease   . DM2 (diabetes mellitus, type 2) (HCC) 06/02/2013  . GERD (gastroesophageal reflux disease)   . Hypertriglyceridemia 06/02/2013  . Multiple personality   . Myocardial infarct (HCC) 03/21/2015   DES LAD  . Neurocardiogenic syncope 06/02/2013  . Obesity (BMI 30.0-34.9) 06/02/2013  . Pacemaker 06/02/2013    Past Surgical History:  Procedure Laterality Date  . CARDIAC CATHETERIZATION  09/26/2002   normal coronary arteries and LV  . CARDIAC CATHETERIZATION N/A 03/21/2015   Procedure: Left Heart Cath and Coronary Angiography;  Surgeon: Lennette Bihari, MD; mLAD 100%, dLAD 80%, D2 80%, EF 50-55%  . CARDIAC CATHETERIZATION N/A 03/21/2015   Procedure: Coronary Stent Intervention;  Surgeon: Lennette Bihari, MD; 3.538 mm Xience Alpine DES to the LAD   . NM MYOCAR PERF WALL MOTION  10/06/2011   No ischemia  . PACEMAKER INSERTION Left 02/03/2003   Medtronic  . PERMANENT PACEMAKER GENERATOR CHANGE N/A 09/28/2012   Procedure: PERMANENT PACEMAKER GENERATOR CHANGE;  Surgeon: Thurmon Fair, MD; Medtronic     Outpatient Medications Prior to Visit  Medication Sig  Dispense Refill  . albuterol (PROVENTIL HFA;VENTOLIN HFA) 108 (90 BASE) MCG/ACT inhaler Inhale 1-2 puffs into the lungs every 6 (six) hours as needed for wheezing or shortness of breath.    . ALPRAZolam (XANAX) 1 MG tablet Take 1 mg by mouth See admin instructions. Take 1 tablet 4 times daily and 2 tablets at  bedtime as needed    . aspirin EC 81 MG EC tablet Take 1 tablet (81 mg total) by mouth daily.    Marland Kitchen atorvastatin (LIPITOR) 80 MG tablet Take 1 tablet (80 mg total) by mouth daily at 6 PM. KEEP OV. 30 tablet 1  . benazepril (LOTENSIN) 5 MG tablet Take 5 mg by mouth daily.    . carvedilol (COREG) 12.5 MG tablet Take 1 tablet (12.5 mg total) by mouth 2 (two) times daily with a meal. KEEP OV. 60 tablet 1  . gabapentin (NEURONTIN) 300 MG capsule Take 300-600 mg by mouth 3 (three) times daily. Takes 1 capsule in am and at lunch and 2 capsules at night    . gemfibrozil (LOPID) 600 MG tablet Take 600 mg by mouth 2 (two) times daily before a meal.    . HUMALOG KWIKPEN 100 UNIT/ML KiwkPen Inject 30 Units as directed daily.    Marland Kitchen HYDROcodone-acetaminophen (NORCO) 7.5-325 MG per tablet Take 1 tablet by mouth 3 (three) times daily.    . Insulin Degludec (TRESIBA FLEXTOUCH [place]) Inject 1 Dose into the skin daily.    . INVOKANA 100 MG TABS tablet Take 1 tablet by mouth daily.    . meclizine (ANTIVERT) 25 MG tablet Take 1 tablet (25 mg total) by mouth 3 (three) times daily as needed for dizziness. 15 tablet 0  . nitroGLYCERIN (NITROSTAT) 0.4 MG SL tablet Place 1 tablet (0.4 mg total) under the tongue every 5 (five) minutes x 3 doses as needed for chest pain. 25 tablet 12  . QUEtiapine (SEROQUEL XR) 300 MG 24 hr tablet Take 600 mg by mouth at bedtime.    . ranitidine (ZANTAC) 150 MG tablet Take 1 tablet by mouth 2 (two) times daily as needed.    . ticagrelor (BRILINTA) 90 MG TABS tablet Take 1 tablet (90 mg total) by mouth 2 (two) times daily. KEEP OV. 60 tablet 1  . tiotropium (SPIRIVA) 18 MCG inhalation capsule Place 18 mcg into inhaler and inhale daily.    . traZODone (DESYREL) 150 MG tablet Take 150-300 mg by mouth at bedtime as needed for sleep.     . ziprasidone (GEODON) 60 MG capsule Take 60 mg by mouth 2 (two) times daily with a meal.     . amoxicillin (AMOXIL) 500 MG capsule Take 1 capsule (500 mg total) by  mouth 3 (three) times daily. (Patient not taking: Reported on 01/18/2016) 30 capsule 0  . LEVEMIR FLEXTOUCH 100 UNIT/ML Pen Inject 20 Units into the skin daily. Reported on 01/18/2016     No facility-administered medications prior to visit.      Allergies:   Shellfish allergy   Social History   Social History  . Marital status: Divorced    Spouse name: N/A  . Number of children: N/A  . Years of education: N/A   Occupational History  . Disabled    Social History Main Topics  . Smoking status: Current Every Day Smoker    Packs/day: 1.00    Types: Cigarettes  . Smokeless tobacco: None  . Alcohol use No  . Drug use: No  . Sexual activity: Not Asked  Other Topics Concern  . None   Social History Narrative   [place]ives in [place], [place].     Family History:  The patient's family history includes Coronary artery disease in his father; Diabetes in his father and mother.   ROS:   Please see the history of present illness.    ROS All other systems reviewed and are negative.   PHYSICAL EXAM:   VS:  BP 116/72   Pulse 80   Ht 5\' 10"  (1.778 m)   Wt 91.8 kg (202 lb 6.4 oz)   BMI 29.04 kg/m    GEN: Well nourished, well developed, in no acute distress  HEENT: normal  Neck: no JVD, carotid bruits, or masses Cardiac: RRR; no murmurs, rubs, or gallops,no edema ; healthy left subclavian pacemaker site Respiratory:  clear to auscultation bilaterally, normal work of breathing GI: soft, nontender, nondistended, + BS MS: no deformity or atrophy  Skin: warm and dry, no rash Neuro:  Alert and Oriented x 3, Strength and sensation are intact Psych: euthymic mood, full affect  Wt Readings from Last 3 Encounters:  04/13/16 91.8 kg (202 lb 6.4 oz)  09/05/15 86.6 kg (191 lb)  08/26/15 86.9 kg (191 lb 8 oz)      Studies/Labs Reviewed:   EKG:  EKG is ordered today.  The ekg ordered today demonstrates normal sinus rhythm, QS pattern in V1-V2, nonspecific T-wave abnormality, QTC 482 ms  Recent  Labs: 12/17/2015: BUN <5; Creatinine, Ser 0.78; Hemoglobin 14.7; Platelets 474; Potassium 2.4; Sodium 129   Lipid Panel    Component Value Date/Time   CHOL 127 03/22/2015 0347   TRIG 198 (H) 03/22/2015 0347   HDL 33 (L) 03/22/2015 0347   CHOLHDL 3.8 03/22/2015 0347   VLDL 40 03/22/2015 0347   LDLCALC 54 03/22/2015 0347     ASSESSMENT:    1. CAD S/P LAD DES 03/21/15   2. Neurocardiogenic syncope   3. Pacemaker - Medtronic adaptadual-chamber implanted 2004, generator change 2014   4. Dyslipidemia   5. Tobacco abuse   6. Type 2 diabetes mellitus with other circulatory complications (HCC)   7. Medication management   8. Prolonged QT interval      PLAN:  In order of problems listed above:   1. CAD: roughly 12 months s/p STEMI and drug-eluting stent to mid LAD artery in the setting of STEMI. He did not have the nuclear stress test with ordered at his last appointment, but his pattern of angina seems to be consistent with stable angina pectoris with infrequent episodes. He is on a reasonably high dose of carvedilol but is not taking any other antianginals. We'll discontinue Brilinta but continue aspirin. He is reminded to seek emergency medical attention for chest pain lasting more than 25-30 minutes. EF was reduced following his myocardial infarction. He is taking ace inhibitors and carvedilol and does not require loop diuretic therapy. Encouraged him to stay well hydrated to avoid his symptoms of orthostatic hypotension. He does not appear to need diuretics and his orthostatic dizziness with certain worsen. 2. Neurally mediated syncope: No recurrence since pacemaker implantation 3. PPM: Normal device function. Infrequent pacing is limited to sudden bradycardia response episodes. We'll get him a new remote transmitter and reestablish downloads every 3 months and office visits yearly 4. HLP: LDL cholesterol levels were at target when last checked. Time to reevaluate. 5. Tobacco abuse:  Discussed smoking cessation at length. He is convinced he cannot quit smoking. 6. DM: A1c 13% which per his  report is an improvement. Increases the likelihood of progression of coronary disease. 7. Long QT interval due to medications 8. Bipolar disorder: Note that he is taking several medications that could prolong the QT interval. Cautious monitoring of and avoid any other drugs that can cause similar QT prolongation.  I told him that if he is unable to travel to [place], we will arrange follow-up in [place] one of my partners. His pacemaker can also be followed in [employer] and Dr. Jenel Lucks monthly device clinic. At this time, he wants to continue coming to [place].   Medication Adjustments/Labs and Tests Ordered: Current medicines are reviewed at length with the patient today.  Concerns regarding medicines are outlined above.  Medication changes, Labs and Tests ordered today are listed in the Patient Instructions below. There are no Patient Instructions on file for this visit.     Signed, Thurmon Fair, MD  04/13/2016 4:31 PM    [facility] [address] [place], [place], [place]  [address] Phone: [phone]; Fax: [phone]

## 2016-04-18 ENCOUNTER — Other Ambulatory Visit: Payer: Self-pay | Admitting: Cardiovascular Disease

## 2016-04-18 NOTE — Telephone Encounter (Signed)
REFILL 

## 2016-04-20 LAB — CUP PACEART INCLINIC DEVICE CHECK
Battery Impedance: 182 Ohm
Battery Remaining Longevity: 144 mo
Battery Voltage: 2.79 V
Brady Statistic AP VP Percent: 0.2 %
Brady Statistic AP VS Percent: 0.1 % — CL
Brady Statistic AS VP Percent: 0.3 %
Brady Statistic AS VS Percent: 99.5 %
Date Time Interrogation Session: 20171004160407
Date Time Interrogation Session: 20171004155025
Implantable Lead Implant Date: 20040719
Implantable Lead Implant Date: 20040719
Implantable Lead Implant Date: 20040719
Implantable Lead Implant Date: 20040719
Implantable Lead Location: 753859
Implantable Lead Location: 753860
Implantable Lead Location: 753859
Implantable Lead Location: 753860
Implantable Lead Model: 5076
Implantable Lead Model: 5076
Implantable Lead Model: 5076
Implantable Lead Model: 5076
Lead Channel Impedance Value: 581 Ohm
Lead Channel Impedance Value: 611 Ohm
Lead Channel Pacing Threshold Amplitude: 0.625 V
Lead Channel Pacing Threshold Amplitude: 1.125 V
Lead Channel Pacing Threshold Pulse Width: 0.4 ms
Lead Channel Pacing Threshold Pulse Width: 0.4 ms
Lead Channel Sensing Intrinsic Amplitude: 2.8 mV
Lead Channel Sensing Intrinsic Amplitude: 8 mV
Lead Channel Setting Pacing Amplitude: 1.5 V
Lead Channel Setting Pacing Amplitude: 2.5 V
Lead Channel Setting Pacing Amplitude: 1.5 V
Lead Channel Setting Pacing Amplitude: 2.5 V
Lead Channel Setting Pacing Pulse Width: 0.4 ms
Lead Channel Setting Pacing Pulse Width: 0.4 ms
Lead Channel Setting Sensing Sensitivity: 5.6 mV
Lead Channel Setting Sensing Sensitivity: 5.6 mV

## 2016-05-25 ENCOUNTER — Encounter (HOSPITAL_COMMUNITY)
Admission: EM | Discharge status: Against Medical Advice | Payer: Self-pay | Source: Other Acute Inpatient Hospital | Attending: Interventional Cardiology

## 2016-05-25 ENCOUNTER — Encounter (HOSPITAL_COMMUNITY): Payer: Self-pay | Admitting: Emergency Medicine

## 2016-05-25 ENCOUNTER — Inpatient Hospital Stay (HOSPITAL_COMMUNITY)
Admission: EM | Admit: 2016-05-25 | Discharge: 2016-05-26 | DRG: 247 | Discharge status: Against Medical Advice | Payer: Medicare Other | Source: Other Acute Inpatient Hospital | Attending: Interventional Cardiology | Admitting: Interventional Cardiology

## 2016-05-25 DIAGNOSIS — E781 Pure hyperglyceridemia: Secondary | ICD-10-CM | POA: Diagnosis present

## 2016-05-25 DIAGNOSIS — I252 Old myocardial infarction: Secondary | ICD-10-CM

## 2016-05-25 DIAGNOSIS — Z720 Tobacco use: Secondary | ICD-10-CM | POA: Diagnosis not present

## 2016-05-25 DIAGNOSIS — F4481 Dissociative identity disorder: Secondary | ICD-10-CM | POA: Diagnosis present

## 2016-05-25 DIAGNOSIS — I213 ST elevation (STEMI) myocardial infarction of unspecified site: Secondary | ICD-10-CM

## 2016-05-25 DIAGNOSIS — I25118 Atherosclerotic heart disease of native coronary artery with other forms of angina pectoris: Secondary | ICD-10-CM | POA: Diagnosis present

## 2016-05-25 DIAGNOSIS — Z91199 Patient's noncompliance with other medical treatment and regimen due to unspecified reason: Secondary | ICD-10-CM

## 2016-05-25 DIAGNOSIS — I10 Essential (primary) hypertension: Secondary | ICD-10-CM

## 2016-05-25 DIAGNOSIS — Z794 Long term (current) use of insulin: Secondary | ICD-10-CM | POA: Diagnosis not present

## 2016-05-25 DIAGNOSIS — Z8249 Family history of ischemic heart disease and other diseases of the circulatory system: Secondary | ICD-10-CM | POA: Diagnosis not present

## 2016-05-25 DIAGNOSIS — E119 Type 2 diabetes mellitus without complications: Secondary | ICD-10-CM | POA: Diagnosis present

## 2016-05-25 DIAGNOSIS — I2111 ST elevation (STEMI) myocardial infarction involving right coronary artery: Secondary | ICD-10-CM | POA: Diagnosis not present

## 2016-05-25 DIAGNOSIS — Z79899 Other long term (current) drug therapy: Secondary | ICD-10-CM

## 2016-05-25 DIAGNOSIS — J449 Chronic obstructive pulmonary disease, unspecified: Secondary | ICD-10-CM | POA: Diagnosis present

## 2016-05-25 DIAGNOSIS — E782 Mixed hyperlipidemia: Secondary | ICD-10-CM | POA: Diagnosis not present

## 2016-05-25 DIAGNOSIS — K219 Gastro-esophageal reflux disease without esophagitis: Secondary | ICD-10-CM | POA: Diagnosis present

## 2016-05-25 DIAGNOSIS — Z6830 Body mass index (BMI) 30.0-30.9, adult: Secondary | ICD-10-CM

## 2016-05-25 DIAGNOSIS — Z833 Family history of diabetes mellitus: Secondary | ICD-10-CM | POA: Diagnosis not present

## 2016-05-25 DIAGNOSIS — Z950 Presence of cardiac pacemaker: Secondary | ICD-10-CM | POA: Diagnosis present

## 2016-05-25 DIAGNOSIS — I255 Ischemic cardiomyopathy: Secondary | ICD-10-CM | POA: Diagnosis present

## 2016-05-25 DIAGNOSIS — I2102 ST elevation (STEMI) myocardial infarction involving left anterior descending coronary artery: Secondary | ICD-10-CM | POA: Diagnosis present

## 2016-05-25 DIAGNOSIS — I2119 ST elevation (STEMI) myocardial infarction involving other coronary artery of inferior wall: Secondary | ICD-10-CM | POA: Insufficient documentation

## 2016-05-25 DIAGNOSIS — I440 Atrioventricular block, first degree: Secondary | ICD-10-CM | POA: Diagnosis present

## 2016-05-25 DIAGNOSIS — E785 Hyperlipidemia, unspecified: Secondary | ICD-10-CM | POA: Diagnosis present

## 2016-05-25 DIAGNOSIS — I9589 Other hypotension: Secondary | ICD-10-CM | POA: Diagnosis not present

## 2016-05-25 DIAGNOSIS — Z91013 Allergy to seafood: Secondary | ICD-10-CM | POA: Diagnosis not present

## 2016-05-25 DIAGNOSIS — F319 Bipolar disorder, unspecified: Secondary | ICD-10-CM | POA: Diagnosis present

## 2016-05-25 DIAGNOSIS — R0789 Other chest pain: Secondary | ICD-10-CM | POA: Diagnosis not present

## 2016-05-25 DIAGNOSIS — E669 Obesity, unspecified: Secondary | ICD-10-CM | POA: Diagnosis present

## 2016-05-25 DIAGNOSIS — E1159 Type 2 diabetes mellitus with other circulatory complications: Secondary | ICD-10-CM

## 2016-05-25 DIAGNOSIS — I214 Non-ST elevation (NSTEMI) myocardial infarction: Secondary | ICD-10-CM | POA: Diagnosis not present

## 2016-05-25 DIAGNOSIS — Z7982 Long term (current) use of aspirin: Secondary | ICD-10-CM

## 2016-05-25 DIAGNOSIS — Z7902 Long term (current) use of antithrombotics/antiplatelets: Secondary | ICD-10-CM

## 2016-05-25 DIAGNOSIS — F17210 Nicotine dependence, cigarettes, uncomplicated: Secondary | ICD-10-CM | POA: Diagnosis present

## 2016-05-25 DIAGNOSIS — I4891 Unspecified atrial fibrillation: Secondary | ICD-10-CM | POA: Diagnosis present

## 2016-05-25 DIAGNOSIS — Z955 Presence of coronary angioplasty implant and graft: Secondary | ICD-10-CM | POA: Diagnosis not present

## 2016-05-25 DIAGNOSIS — I2510 Atherosclerotic heart disease of native coronary artery without angina pectoris: Secondary | ICD-10-CM | POA: Diagnosis not present

## 2016-05-25 DIAGNOSIS — E876 Hypokalemia: Secondary | ICD-10-CM | POA: Diagnosis present

## 2016-05-25 DIAGNOSIS — Z9119 Patient's noncompliance with other medical treatment and regimen: Secondary | ICD-10-CM | POA: Diagnosis not present

## 2016-05-25 DIAGNOSIS — R079 Chest pain, unspecified: Secondary | ICD-10-CM | POA: Diagnosis not present

## 2016-05-25 DIAGNOSIS — Z9861 Coronary angioplasty status: Secondary | ICD-10-CM

## 2016-05-25 DIAGNOSIS — J441 Chronic obstructive pulmonary disease with (acute) exacerbation: Secondary | ICD-10-CM | POA: Diagnosis not present

## 2016-05-25 HISTORY — PX: CARDIAC CATHETERIZATION: SHX172

## 2016-05-25 LAB — BASIC METABOLIC PANEL
Anion gap: 13 (ref 5–15)
BUN: 8 mg/dL (ref 6–20)
CO2: 24 mmol/L (ref 22–32)
Calcium: 8 mg/dL — ABNORMAL LOW (ref 8.9–10.3)
Chloride: 97 mmol/L — ABNORMAL LOW (ref 101–111)
Creatinine, Ser: 0.93 mg/dL (ref 0.61–1.24)
GFR calc Af Amer: >60 mL/min (ref >60)
GFR calc non Af Amer: >60 mL/min (ref >60)
Glucose, Bld: 338 mg/dL — ABNORMAL HIGH (ref 65–99)
Potassium: 3.2 mmol/L — ABNORMAL LOW (ref 3.5–5.1)
Sodium: 134 mmol/L — ABNORMAL LOW (ref 135–145)

## 2016-05-25 LAB — GLUCOSE, CAPILLARY
Glucose-Capillary: 339 mg/dL — ABNORMAL HIGH (ref 65–99)
Glucose-Capillary: 401 mg/dL — ABNORMAL HIGH (ref 65–99)
Glucose-Capillary: 278 mg/dL — ABNORMAL HIGH (ref 65–99)
Glucose-Capillary: 283 mg/dL — ABNORMAL HIGH (ref 65–99)
Glucose-Capillary: 149 mg/dL — ABNORMAL HIGH (ref 65–99)

## 2016-05-25 LAB — TROPONIN I
Troponin I: 14.9 ng/mL (ref <0.03)
Troponin I: 19.85 ng/mL (ref <0.03)
Troponin I: 15.8 ng/mL (ref <0.03)

## 2016-05-25 LAB — MRSA PCR SCREENING: MRSA by PCR: NEGATIVE

## 2016-05-25 LAB — POCT ACTIVATED CLOTTING TIME: Activated Clotting Time: 252 seconds

## 2016-05-25 SURGERY — LEFT HEART CATH AND CORONARY ANGIOGRAPHY
Anesthesia: LOCAL

## 2016-05-25 MED ORDER — ASPIRIN 81 MG PO CHEW
81.0000 mg | CHEWABLE_TABLET | Freq: Every day | ORAL | Status: DC
  Administered 2016-05-25 – 2016-05-26 (×2): 81 mg via ORAL
  Filled 2016-05-25 (×2): qty 1

## 2016-05-25 MED ORDER — ASPIRIN 81 MG PO TBEC: 81.0000 mg | DELAYED_RELEASE_TABLET | Freq: Every day | ORAL | Status: DC

## 2016-05-25 MED ORDER — GABAPENTIN 300 MG PO CAPS
600.0000 mg | ORAL_CAPSULE | Freq: Every day | ORAL | Status: DC
  Administered 2016-05-25: 600 mg via ORAL
  Filled 2016-05-25: qty 2

## 2016-05-25 MED ORDER — SODIUM CHLORIDE 0.9% FLUSH
3.0000 mL | Freq: Two times a day (BID) | INTRAVENOUS | Status: DC
  Administered 2016-05-25 – 2016-05-26 (×3): 3 mL via INTRAVENOUS

## 2016-05-25 MED ORDER — IOPAMIDOL (ISOVUE-370) INJECTION 76%
INTRAVENOUS | Status: AC
  Filled 2016-05-25: qty 125

## 2016-05-25 MED ORDER — HEPARIN (PORCINE) IN NACL 2-0.9 UNIT/ML-% IJ SOLN
INTRAMUSCULAR | Status: DC | PRN
  Administered 2016-05-25: 1000 mL

## 2016-05-25 MED ORDER — INSULIN GLARGINE 100 UNIT/ML ~~LOC~~ SOLN
40.0000 [IU] | Freq: Every day | SUBCUTANEOUS | Status: DC
  Administered 2016-05-25 – 2016-05-26 (×2): 40 [IU] via SUBCUTANEOUS
  Filled 2016-05-25 (×2): qty 0.4

## 2016-05-25 MED ORDER — HEPARIN SODIUM (PORCINE) 1000 UNIT/ML IJ SOLN
INTRAMUSCULAR | Status: DC | PRN
Start: 2016-05-25 — End: 2016-05-25
  Administered 2016-05-25: 6000 [IU] via INTRAVENOUS

## 2016-05-25 MED ORDER — GEMFIBROZIL 600 MG PO TABS
600.0000 mg | ORAL_TABLET | Freq: Two times a day (BID) | ORAL | Status: DC
  Administered 2016-05-25: 600 mg via ORAL
  Filled 2016-05-25 (×2): qty 1

## 2016-05-25 MED ORDER — LIDOCAINE HCL (PF) 1 % IJ SOLN
INTRAMUSCULAR | Status: AC
  Filled 2016-05-25: qty 30

## 2016-05-25 MED ORDER — VERAPAMIL HCL 2.5 MG/ML IV SOLN
INTRAVENOUS | Status: DC | PRN
  Administered 2016-05-25: 04:00:00 via INTRA_ARTERIAL

## 2016-05-25 MED ORDER — SODIUM CHLORIDE 0.9% FLUSH: 3.0000 mL | INTRAVENOUS | Status: DC | PRN

## 2016-05-25 MED ORDER — HEPARIN (PORCINE) IN NACL 2-0.9 UNIT/ML-% IJ SOLN
INTRAMUSCULAR | Status: AC
  Filled 2016-05-25: qty 1000

## 2016-05-25 MED ORDER — SODIUM CHLORIDE 0.9 % IV SOLN
INTRAVENOUS | Status: DC
  Administered 2016-05-25: 19:00:00 via INTRAVENOUS

## 2016-05-25 MED ORDER — ZIPRASIDONE HCL 60 MG PO CAPS
60.0000 mg | ORAL_CAPSULE | Freq: Two times a day (BID) | ORAL | Status: DC
  Administered 2016-05-25 – 2016-05-26 (×4): 60 mg via ORAL
  Filled 2016-05-25: qty 1
  Filled 2016-05-25 (×3): qty 3

## 2016-05-25 MED ORDER — TIOTROPIUM BROMIDE MONOHYDRATE 18 MCG IN CAPS
18.0000 ug | ORAL_CAPSULE | Freq: Every day | RESPIRATORY_TRACT | Status: DC
  Administered 2016-05-25 – 2016-05-26 (×2): 18 ug via RESPIRATORY_TRACT
  Filled 2016-05-25 (×2): qty 5

## 2016-05-25 MED ORDER — INSULIN ASPART 100 UNIT/ML ~~LOC~~ SOLN
10.0000 [IU] | Freq: Three times a day (TID) | SUBCUTANEOUS | Status: DC
  Administered 2016-05-25 – 2016-05-26 (×5): 10 [IU] via SUBCUTANEOUS

## 2016-05-25 MED ORDER — FENTANYL CITRATE (PF) 100 MCG/2ML IJ SOLN
INTRAMUSCULAR | Status: DC | PRN
  Administered 2016-05-25: 25 ug via INTRAVENOUS

## 2016-05-25 MED ORDER — ACETAMINOPHEN 325 MG PO TABS: 650.0000 mg | ORAL_TABLET | ORAL | Status: DC | PRN

## 2016-05-25 MED ORDER — ONDANSETRON HCL 4 MG/2ML IJ SOLN: 4.0000 mg | Freq: Four times a day (QID) | INTRAMUSCULAR | Status: DC | PRN

## 2016-05-25 MED ORDER — BENAZEPRIL HCL 5 MG PO TABS
5.0000 mg | ORAL_TABLET | Freq: Every day | ORAL | Status: DC
  Administered 2016-05-25: 5 mg via ORAL
  Filled 2016-05-25 (×2): qty 1

## 2016-05-25 MED ORDER — GABAPENTIN 300 MG PO CAPS
300.0000 mg | ORAL_CAPSULE | Freq: Two times a day (BID) | ORAL | Status: DC
  Administered 2016-05-25 – 2016-05-26 (×4): 300 mg via ORAL
  Filled 2016-05-25 (×4): qty 1

## 2016-05-25 MED ORDER — CLOPIDOGREL BISULFATE 300 MG PO TABS
ORAL_TABLET | ORAL | Status: AC
  Filled 2016-05-25: qty 1

## 2016-05-25 MED ORDER — ALPRAZOLAM 0.5 MG PO TABS
1.0000 mg | ORAL_TABLET | Freq: Four times a day (QID) | ORAL | Status: DC
  Administered 2016-05-25 (×4): 1 mg via ORAL
  Administered 2016-05-26: 0.5 mg via ORAL
  Administered 2016-05-26: 1 mg via ORAL
  Filled 2016-05-25 (×7): qty 2

## 2016-05-25 MED ORDER — ADENOSINE (DIAGNOSTIC) FOR INTRACORONARY USE
INTRAVENOUS | Status: DC | PRN
  Administered 2016-05-25 (×4): 30 ug via INTRACORONARY

## 2016-05-25 MED ORDER — ALPRAZOLAM 0.5 MG PO TABS
2.0000 mg | ORAL_TABLET | Freq: Every evening | ORAL | Status: DC | PRN
Start: 2016-05-25 — End: 2016-05-26

## 2016-05-25 MED ORDER — NITROGLYCERIN 0.4 MG SL SUBL: 0.4000 mg | SUBLINGUAL_TABLET | SUBLINGUAL | Status: DC | PRN

## 2016-05-25 MED ORDER — INSULIN LISPRO 100 UNIT/ML (KWIKPEN): 30.0000 [IU] | PEN_INJECTOR | Freq: Every day | SUBCUTANEOUS | Status: DC

## 2016-05-25 MED ORDER — POTASSIUM CHLORIDE CRYS ER 20 MEQ PO TBCR
40.0000 meq | EXTENDED_RELEASE_TABLET | Freq: Three times a day (TID) | ORAL | Status: AC
  Administered 2016-05-25 (×2): 40 meq via ORAL
  Filled 2016-05-25 (×2): qty 2

## 2016-05-25 MED ORDER — MIDAZOLAM HCL 2 MG/2ML IJ SOLN
INTRAMUSCULAR | Status: AC
  Filled 2016-05-25: qty 2

## 2016-05-25 MED ORDER — ADENOSINE 12 MG/4ML IV SOLN
INTRAVENOUS | Status: AC
  Filled 2016-05-25: qty 4

## 2016-05-25 MED ORDER — TIROFIBAN HCL IN NACL 5-0.9 MG/100ML-% IV SOLN: 0.1500 ug/kg/min | INTRAVENOUS | Status: AC

## 2016-05-25 MED ORDER — TIROFIBAN HCL IN NACL 5-0.9 MG/100ML-% IV SOLN
INTRAVENOUS | Status: AC
  Filled 2016-05-25: qty 100

## 2016-05-25 MED ORDER — CLOPIDOGREL BISULFATE 75 MG PO TABS
75.0000 mg | ORAL_TABLET | Freq: Every day | ORAL | Status: DC
  Administered 2016-05-26: 75 mg via ORAL
  Filled 2016-05-25 (×2): qty 1

## 2016-05-25 MED ORDER — SODIUM CHLORIDE 0.9 % IV BOLUS (SEPSIS)
500.0000 mL | Freq: Once | INTRAVENOUS | Status: AC
  Administered 2016-05-25: 500 mL via INTRAVENOUS

## 2016-05-25 MED ORDER — CLOPIDOGREL BISULFATE 300 MG PO TABS
ORAL_TABLET | ORAL | Status: DC | PRN
  Administered 2016-05-25: 300 mg via ORAL

## 2016-05-25 MED ORDER — SODIUM CHLORIDE 0.9 % IV SOLN: 250.0000 mL | INTRAVENOUS | Status: DC | PRN

## 2016-05-25 MED ORDER — TIROFIBAN HCL IN NACL 5-0.9 MG/100ML-% IV SOLN
INTRAVENOUS | Status: DC | PRN
  Administered 2016-05-25: 0.15 ug/kg/min via INTRAVENOUS

## 2016-05-25 MED ORDER — VERAPAMIL HCL 2.5 MG/ML IV SOLN
INTRAVENOUS | Status: AC
  Filled 2016-05-25: qty 2

## 2016-05-25 MED ORDER — MIDAZOLAM HCL 2 MG/2ML IJ SOLN
INTRAMUSCULAR | Status: DC | PRN
  Administered 2016-05-25: 1 mg via INTRAVENOUS

## 2016-05-25 MED ORDER — ALBUTEROL SULFATE (2.5 MG/3ML) 0.083% IN NEBU
3.0000 mL | INHALATION_SOLUTION | Freq: Four times a day (QID) | RESPIRATORY_TRACT | Status: DC | PRN
  Administered 2016-05-26: 3 mL via RESPIRATORY_TRACT
  Filled 2016-05-25: qty 3

## 2016-05-25 MED ORDER — TRAZODONE HCL 50 MG PO TABS: 150.0000 mg | ORAL_TABLET | Freq: Every evening | ORAL | Status: DC | PRN

## 2016-05-25 MED ORDER — FENTANYL CITRATE (PF) 100 MCG/2ML IJ SOLN
INTRAMUSCULAR | Status: AC
  Filled 2016-05-25: qty 2

## 2016-05-25 MED ORDER — TIROFIBAN (AGGRASTAT) BOLUS VIA INFUSION
INTRAVENOUS | Status: DC | PRN
  Administered 2016-05-25: 2300 ug via INTRAVENOUS

## 2016-05-25 MED ORDER — QUETIAPINE FUMARATE ER 300 MG PO TB24
600.0000 mg | ORAL_TABLET | Freq: Every day | ORAL | Status: DC
  Administered 2016-05-25: 600 mg via ORAL
  Filled 2016-05-25 (×2): qty 2

## 2016-05-25 MED ORDER — HEPARIN SODIUM (PORCINE) 1000 UNIT/ML IJ SOLN
INTRAMUSCULAR | Status: AC
  Filled 2016-05-25: qty 1

## 2016-05-25 MED ORDER — ATORVASTATIN CALCIUM 80 MG PO TABS
80.0000 mg | ORAL_TABLET | Freq: Every day | ORAL | Status: DC
  Administered 2016-05-25 – 2016-05-26 (×2): 80 mg via ORAL
  Filled 2016-05-25 (×2): qty 1

## 2016-05-25 MED ORDER — INSULIN ASPART 100 UNIT/ML ~~LOC~~ SOLN
0.0000 [IU] | Freq: Three times a day (TID) | SUBCUTANEOUS | Status: DC
  Administered 2016-05-25: 11 [IU] via SUBCUTANEOUS
  Administered 2016-05-25: 15 [IU] via SUBCUTANEOUS
  Administered 2016-05-25: 8 [IU] via SUBCUTANEOUS
  Administered 2016-05-26: 3 [IU] via SUBCUTANEOUS
  Administered 2016-05-26: 5 [IU] via SUBCUTANEOUS
  Administered 2016-05-26: 11 [IU] via SUBCUTANEOUS

## 2016-05-25 MED ORDER — PNEUMOCOCCAL VAC POLYVALENT 25 MCG/0.5ML IJ INJ: 0.5000 mL | INJECTION | INTRAMUSCULAR | Status: DC

## 2016-05-25 MED ORDER — ORAL CARE MOUTH RINSE
15.0000 mL | Freq: Two times a day (BID) | OROMUCOSAL | Status: DC
  Administered 2016-05-25 (×2): 15 mL via OROMUCOSAL

## 2016-05-25 MED ORDER — IOPAMIDOL (ISOVUE-370) INJECTION 76%
INTRAVENOUS | Status: DC | PRN
  Administered 2016-05-25: 125 mL via INTRA_ARTERIAL

## 2016-05-25 MED ORDER — FAMOTIDINE 20 MG PO TABS
10.0000 mg | ORAL_TABLET | Freq: Two times a day (BID) | ORAL | Status: DC
  Administered 2016-05-25 – 2016-05-26 (×3): 10 mg via ORAL
  Filled 2016-05-25 (×3): qty 1

## 2016-05-25 MED ORDER — SODIUM CHLORIDE 0.9 % WEIGHT BASED INFUSION: 1.0000 mL/kg/h | INTRAVENOUS | Status: AC

## 2016-05-25 SURGICAL SUPPLY — 17 items
BALLN MOZEC 2.25X20 (BALLOONS) ×2
BALLN ~~LOC~~ MOZEC 3.5X15 (BALLOONS) ×2
BALLOON MOZEC 2.25X20 (BALLOONS) IMPLANT
BALLOON ~~LOC~~ MOZEC 3.5X15 (BALLOONS) IMPLANT
CATH INFINITI 5FR JL4 (CATHETERS) ×1 IMPLANT
DEVICE RAD COMP TR BAND LRG (VASCULAR PRODUCTS) ×1 IMPLANT
GLIDESHEATH SLEND SS 6F .021 (SHEATH) ×1 IMPLANT
GUIDE CATH RUNWAY 6FR FR4 (CATHETERS) ×1 IMPLANT
KIT ENCORE 26 ADVANTAGE (KITS) ×1 IMPLANT
KIT HEART LEFT (KITS) ×2 IMPLANT
PACK CARDIAC CATHETERIZATION (CUSTOM PROCEDURE TRAY) ×2 IMPLANT
STENT SYNERGY DES 3X28 (Permanent Stent) ×1 IMPLANT
TRANSDUCER W/STOPCOCK (MISCELLANEOUS) ×2 IMPLANT
TUBING CIL FLEX 10 FLL-RA (TUBING) ×2 IMPLANT
VALVE GUARDIAN II ~~LOC~~ HEMO (MISCELLANEOUS) ×1 IMPLANT
WIRE ASAHI PROWATER 180CM (WIRE) ×1 IMPLANT
WIRE SAFE-T 1.5MM-J .035X260CM (WIRE) ×1 IMPLANT

## 2016-05-25 NOTE — Progress Notes (Signed)
   05/25/16 0355  Clinical Encounter Type  Visited With Health care provider  Visit Type ED  Referral From Other (Comment) Valinda Party)  Spiritual Encounters  Spiritual Needs Emotional  Stress Factors  Patient Stress Factors Health changes  Pt awaiting Cath Lab. No indication of family contact. [place] available as needed.

## 2016-05-25 NOTE — ED Notes (Signed)
EKG obtained and given to [facility] MD, pt was taken up to cath lab with this RN and cards.

## 2016-05-25 NOTE — ED Triage Notes (Signed)
Pt in [facility] as tx from [place], initially called out as NSTEMI that turned into STEMI. Pt has CP that started approx 0000, central pressure with radiation to L arm. Hx MI, stents placed last year. Pt was given ASA, NTG, Morphine, Heparin, Plavix. Hypokalemic K 2.8. Troponin 0.15. BNP 1800. Given NS for hypotension

## 2016-05-25 NOTE — Progress Notes (Signed)
Patient's BP 74/57, asleep. Wakes easily and BP increases to 90's systolic.  Cards NP notified.

## 2016-05-25 NOTE — Progress Notes (Signed)
[place] MD notified of pt BP 67/51 (58), pt with no complaints, asleep but arouses easily. Orders for 500 cc bolus. Will continue to monitor.

## 2016-05-25 NOTE — Progress Notes (Signed)
Trop 14.94. Patient post-cath STEMI, expected value.  MD aware. [place], Mitzi Hansen

## 2016-05-25 NOTE — H&P (Addendum)
History & Physical    Patient ID: Frank Powell MRN: [id number], DOB/AGE: 55/12/1960   Admit date: 05/25/2016   Primary Physician: Frank DeitersXAJE A HASANAJ, MD Primary Cardiologist: Frank Powell  Patient Profile    Pt is a 55 yo M w/ a PMH significant for 1vCAD c/b STEMI mid-LAD, afib/flutter, neurocardiogenic syncope s/p DC PPM, poorly controlled DMII, COPD, and psych p/w CP.  Past Medical History   Past Medical History:  Diagnosis Date  . Atrial fib/flutter, transient   . Bipolar 1 disorder (HCC)   . COPD (chronic obstructive pulmonary disease) (HCC)   . Coronary artery disease   . DM2 (diabetes mellitus, type 2) (HCC) 06/02/2013  . GERD (gastroesophageal reflux disease)   . Hypertriglyceridemia 06/02/2013  . Multiple personality   . Myocardial infarct 03/21/2015   DES LAD  . Neurocardiogenic syncope 06/02/2013  . Obesity (BMI 30.0-34.9) 06/02/2013  . Pacemaker 06/02/2013    Past Surgical History:  Procedure Laterality Date  . CARDIAC CATHETERIZATION  09/26/2002   normal coronary arteries and LV  . CARDIAC CATHETERIZATION N/A 03/21/2015   Procedure: Left Heart Cath and Coronary Angiography;  Surgeon: Frank Biharihomas A Kelly, MD; mLAD 100%, dLAD 80%, D2 80%, EF 50-55%  . CARDIAC CATHETERIZATION N/A 03/21/2015   Procedure: Coronary Stent Intervention;  Surgeon: Frank Biharihomas A Kelly, MD; 3.538 mm Xience Alpine DES to the LAD   . NM MYOCAR PERF WALL MOTION  10/06/2011   No ischemia  . PACEMAKER INSERTION Left 02/03/2003   Medtronic  . PERMANENT PACEMAKER GENERATOR CHANGE N/A 09/28/2012   Procedure: PERMANENT PACEMAKER GENERATOR CHANGE;  Surgeon: Frank FairMihai Croitoru, MD; [employer]     Allergies  Allergies  Allergen Reactions  . Shellfish Allergy Nausea And Vomiting   History of Present Illness   Hx obtained per pt and review of excellent clinic note from Dr. Thurmon FairMihai Powell. Briefly, pt has longstanding hx of psych and noncompliance issues. Had an anterior STEMI 03/2015. Placed a 3.5X38 mm Xience  Alpine in mid-LAD. EF known to be moderate-severely reduced. Heavy smoker. Poorly controlled HgbA1c ~13%. Bipolar and multiple personality d/o on antipsychotics. Had been having stable angina symptoms per last clinic note. Continued ASA for a year and discontinued ticagrelor in 03/2016. At around 23:00 11/7 pt began experiencing crushing substernal CP radiating to back. +Diaphoresis and SOB. Felt similar to prior AMI. Presented to outside ER. Initially hypotensive but BP improved with IVF. EKG a-sensed, v-paced. POC trop neg. Given heparin bolus, ASA, and clopidogrel 300 mg. Accepted for transfer. When transport arrived pt still having CP and EKG now revealed sinus with 1st degree AV block and subtle inferior STE. Has had Q waves in right precordial leads. Code STEMI activated and taken emergently to cath lab.   Home Medications    Prior to Admission medications   Medication Sig Start Date End Date Taking? Authorizing Provider  albuterol (PROVENTIL HFA;VENTOLIN HFA) 108 (90 BASE) MCG/ACT inhaler Inhale 1-2 puffs into the lungs every 6 (six) hours as needed for wheezing or shortness of breath.    Historical Provider, MD  ALPRAZolam Prudy Feeler(XANAX) 1 MG tablet Take 1 mg by mouth See admin instructions. Take 1 tablet 4 times daily and 2 tablets at bedtime as needed    Historical Provider, MD  aspirin EC 81 MG EC tablet Take 1 tablet (81 mg total) by mouth daily. 03/23/15   Frank MelenaBryan W Hager, PA-C  atorvastatin (LIPITOR) 80 MG tablet TAKE 1 TABLET DAILY AT 6 PM. 04/18/16   Frank FairMihai Croitoru, MD  benazepril (LOTENSIN) 5 MG tablet Take 5 mg by mouth daily.    Historical Provider, MD  BRILINTA 90 MG TABS tablet TAKE 1 TABLET 2 TIMES A DAY. 04/18/16   Mihai Croitoru, MD  carvedilol (COREG) 12.5 MG tablet Take 1 tablet (12.5 mg total) by mouth 2 (two) times daily with a meal. KEEP OV. 02/23/16   Mihai Croitoru, MD  gabapentin (NEURONTIN) 300 MG capsule Take 300-600 mg by mouth 3 (three) times daily. Takes 1 capsule in am and at lunch  and 2 capsules at night    Historical Provider, MD  gemfibrozil (LOPID) 600 MG tablet Take 600 mg by mouth 2 (two) times daily before a meal.    Historical Provider, MD  HUMALOG KWIKPEN 100 UNIT/ML KiwkPen Inject 30 Units as directed daily. 08/21/15   Historical Provider, MD  HYDROcodone-acetaminophen (NORCO) 7.5-325 MG per tablet Take 1 tablet by mouth 3 (three) times daily. 02/26/15   Historical Provider, MD  Insulin Degludec (TRESIBA FLEXTOUCH [place]) Inject 1 Dose into the skin daily.    Historical Provider, MD  INVOKANA 100 MG TABS tablet Take 1 tablet by mouth daily. 08/21/15   Historical Provider, MD  meclizine (ANTIVERT) 25 MG tablet Take 1 tablet (25 mg total) by mouth 3 (three) times daily as needed for dizziness. 12/17/15   Frank Jester, DO  nitroGLYCERIN (NITROSTAT) 0.4 MG SL tablet Place 1 tablet (0.4 mg total) under the tongue every 5 (five) minutes x 3 doses as needed for chest pain. 03/23/15   Frank Melena, PA-C  QUEtiapine (SEROQUEL XR) 300 MG 24 hr tablet Take 600 mg by mouth at bedtime.    Historical Provider, MD  ranitidine (ZANTAC) 150 MG tablet Take 1 tablet by mouth 2 (two) times daily as needed. 08/21/15   Historical Provider, MD  tiotropium (SPIRIVA) 18 MCG inhalation capsule Place 18 mcg into inhaler and inhale daily.    Historical Provider, MD  traZODone (DESYREL) 150 MG tablet Take 150-300 mg by mouth at bedtime as needed for sleep.     Historical Provider, MD  ziprasidone (GEODON) 60 MG capsule Take 60 mg by mouth 2 (two) times daily with a meal.     Historical Provider, MD   Family History    Family History  Problem Relation Age of Onset  . Coronary artery disease Father   . Diabetes Mother   . Diabetes Father    Social History    Social History   Social History  . Marital status: Divorced    Spouse name: N/A  . Number of children: N/A  . Years of education: N/A   Occupational History  . Disabled    Social History Main Topics  . Smoking status: Current Every  Day Smoker    Packs/day: 1.00    Types: Cigarettes  . Smokeless tobacco: Not on file  . Alcohol use No  . Drug use: No  . Sexual activity: Not on file   Other Topics Concern  . Not on file   Social History Narrative   Lives in [place], [place].    Review of Systems    General:  No chills, fever, night sweats or weight changes.  Cardiovascular:  No chest pain, dyspnea on exertion, edema, orthopnea, palpitations, paroxysmal nocturnal dyspnea. Dermatological: No rash, lesions/masses Respiratory: No cough, dyspnea Urologic: No hematuria, dysuria Abdominal:   No nausea, vomiting, diarrhea, bright red blood per rectum, melena, or hematemesis Neurologic:  No visual changes, wkns, changes in mental status. All other systems reviewed  and are otherwise negative except as noted above.  Physical Exam    There were no vitals taken for this visit.  General: Pleasant, NAD Psych: Normal affect. Neuro: Alert and oriented X 3. Moves all extremities spontaneously. HEENT: Normal  Neck: Supple without bruits or JVD. Lungs:  Resp regular and unlabored, CTA. Heart: RRR no s3, s4, or murmurs. Abdomen: Soft, non-tender, non-distended, BS + x 4.  Extremities: No clubbing, cyanosis or edema. DP/PT/Radials 2+ and equal bilaterally.  Labs    Troponin (Point of Care Test) No results for input(s): TROPIPOC in the last 72 hours. No results for input(s): CKTOTAL, CKMB, TROPONINI in the last 72 hours. Lab Results  Component Value Date   WBC 18.2 (H) 12/17/2015   HGB 14.7 12/17/2015   HCT 41.2 12/17/2015   MCV 88.0 12/17/2015   PLT 474 (H) 12/17/2015   No results for input(s): NA, K, CL, CO2, BUN, CREATININE, CALCIUM, PROT, BILITOT, ALKPHOS, ALT, AST, GLUCOSE in the last 168 hours.  Invalid input(s): LABALBU Lab Results  Component Value Date   CHOL 127 03/22/2015   HDL 33 (L) 03/22/2015   LDLCALC 54 03/22/2015   TRIG 198 (H) 03/22/2015   Lab Results  Component Value Date   DDIMER 0.44 12/17/2015     Radiology Studies    No results found.  ECG & Cardiac Imaging    EKG - sinus w/ 1st degree AV block, anterior Q waves, subtle inferior STE  Assessment & Plan   Pt is a 55 yo M w/ a PMH significant for 1vCAD c/b STEMI mid-LAD, afib/flutter, neurocardiogenic syncope s/p DC PPM, poorly controlled DMII, COPD, and psych p/w CP.  #CP, Presumed Inferior vs. Anterior STEMI: Classic symptoms. Multiple risk factors. Recently taken off ticagrelor. Has had CP refractory to medical therapy, hypotension, and possible STE inferior leads. Cath lab activated. -admit to tele post-cath -check TFTs, lipid panel, HgbA1c -order TTE to assess LV fxn -high-dose atorvastatin -likely ASA and ticagrelor for life -continue carvedilol and ACEI for reduced LV fxn -counsel on smoking cessation and consider endocrine consult for diabetes management  #COPD -continue home inhalers  #Psych -continue home antipsychotics  #FEN/GI -hold IVF -replete lytes PRN -cardiac/diabetic diet  #PPx -heparin SUBQ q 8 hrs/SCDs -no PPI indicated  #Dispo -telemetry for at least 48 hours -pending w/u and eval  Signed, Vista Mink, MD 05/25/2016, 4:06 AM   I have examined the patient and reviewed assessment and plan and discussed with patient.  Agree with above as stated.  S/p DES to the RCA.  I personally reviewed the ECGs and made the decision to bring the patient to the cath lab.  New ST changes inferiorly.  Continue IIb/IIIa inhibitor for 4 hours.  Will need to check with pharmacy on compatibility of clopidogrel, effient or brilinta with his psych meds.  Compliance may be an issue as well.  I spoke to the patient's brother and informed him of the situation.  Consider PCI of ramus.  He has had significant progression of CAD in the past year. BP too low for beta blocker or ACE-I at this time.  Reassess in AM.    Lance Muss

## 2016-05-25 NOTE — Progress Notes (Signed)
Patient has wrist watch, clothing and $71 in cash (3 $20 bills, 1 $10 bill and 1 $1).  Patient's brother coming to visit patient this evening and will take valuables home. [place], Mitzi Hansen

## 2016-05-25 NOTE — Progress Notes (Signed)
Patient Name: Frank Powell Date of Encounter: 05/25/2016  Primary Cardiologist: Dr. Rachelle Hora [facility] Problem List     Active Problems:   Acute MI, inferior wall [facility])     Subjective   Patient feels better this afternoon, no further chest pain. He denies any shortness of breath. He feels tired.  Inpatient Medications    Scheduled Meds: . ALPRAZolam  1 mg Oral QID  . aspirin  81 mg Oral Daily  . atorvastatin  80 mg Oral q1800  . benazepril  5 mg Oral Daily  . clopidogrel  75 mg Oral Q breakfast  . famotidine  10 mg Oral BID  . gabapentin  300 mg Oral BID WC  . gabapentin  600 mg Oral QHS  . insulin aspart  0-15 Units Subcutaneous TID WC  . insulin aspart  10 Units Subcutaneous TID WC  . insulin glargine  40 Units Subcutaneous Daily  . mouth rinse  15 mL Mouth Rinse BID  . potassium chloride  40 mEq Oral TID  . QUEtiapine  600 mg Oral QHS  . sodium chloride flush  3 mL Intravenous Q12H  . tiotropium  18 mcg Inhalation Daily  . ziprasidone  60 mg Oral BID WC   Continuous Infusions:  PRN Meds: sodium chloride, acetaminophen, albuterol, ALPRAZolam, nitroGLYCERIN, ondansetron (ZOFRAN) IV, sodium chloride flush, traZODone   Vital Signs    Vitals:   05/25/16 1222 05/25/16 1300 05/25/16 1400 05/25/16 1415  BP:  (!) 80/61 (!) 81/60 91/78  Pulse:  78 79 84  Resp:  (!) 22 (!) 21 (!) 21  Temp:      TempSrc:      SpO2: 94% 92% 91% 94%  Weight:      Height:        Intake/Output Summary (Last 24 hours) at 05/25/16 1546 Last data filed at 05/25/16 1505  Gross per 24 hour  Intake          1290.98 ml  Output             1225 ml  Net            65.98 ml   Filed Weights   05/25/16 0531  Weight: 209 lb 4.8 oz (94.9 kg)    Physical Exam    GEN: Well nourished, well developed, in no acute distress, lethargic.  HEENT: Grossly normal.  Neck: Supple, no JVD. Cardiac: RRR, no murmurs, rubs, or gallops. No clubbing, cyanosis, edema.  Radials/DP 2+ and equal  bilaterally.  Respiratory:  Coarse expiratory wheezing bilaterally GI: Soft, nontender, nondistended. MS: no deformity or atrophy. Skin: warm and dry, no rash. Psych: AAOx3.  Normal affect.  Labs    CBC No results for input(s): WBC, NEUTROABS, HGB, HCT, MCV, PLT in the last 72 hours. Basic Metabolic Panel  Recent Labs  05/25/16 0914  NA 134*  K 3.2*  CL 97*  CO2 24  GLUCOSE 338*  BUN 8  CREATININE 0.93  CALCIUM 8.0*   Liver Function Tests No results for input(s): AST, ALT, ALKPHOS, BILITOT, PROT, ALBUMIN in the last 72 hours. No results for input(s): LIPASE, AMYLASE in the last 72 hours. Cardiac Enzymes  Recent Labs  05/25/16 0914 05/25/16 1245  TROPONINI 14.90* 19.85*   BNP Invalid input(s): POCBNP D-Dimer No results for input(s): DDIMER in the last 72 hours. Hemoglobin A1C No results for input(s): HGBA1C in the last 72 hours. Fasting Lipid Panel No results for input(s): CHOL, HDL, LDLCALC, TRIG, CHOLHDL, LDLDIRECT  in the last 72 hours. Thyroid Function Tests No results for input(s): TSH, T4TOTAL, T3FREE, THYROIDAB in the last 72 hours.  Invalid input(s): FREET3  Telemetry    Sinus rhythm, several paired PVCs - Personally Reviewed  ECG    Sinus rhythm, 1st degree AV block, q waves anterior leads, slight ST elevation inferior leads - Personally Reviewed  Radiology    No results found.  Cardiac Studies   LHC, Coronary Angiography, Coronary Stent Intervention 05/25/2016:  LV end diastolic pressure is mildly elevated. LVEDP 18 mm Hg.  Ramus lesion, 80 %stenosed.  Ost 1st Mrg to 1st Mrg lesion, 75 %stenosed.  Ost 2nd Diag lesion, 80 %stenosed. Small vessel.  Mid RCA lesion, 99 %stenosed. A STENT SYNERGY DES 3X28 drug eluting stent was successfully placed and postdilated to 3.5 mm.  Post intervention, there is a 0% residual stenosis.  Ost RPDA lesion, 80 %stenosed.   Patient Profile     Pt is a 55 yo M w/ a PMH significant for 1vCAD c/b  STEMI mid-LAD, afib/flutter, neurocardiogenic syncope s/p DC PPM, poorly controlled DMII, COPD, and psych p/w CP.  Assessment & Plan    Inferior STEMI: Subtle ST elevation in inferior leads, patient taken emergently to cath lab with lesions noted as above. DES successfully placed to mid RCA (99% stenosis) with 0% residual stenosis. Troponin trend 14.90 >> 19.85. -TTE pending -D/c Gemfibrozil -Continue Atorvastatin 80 mg daily -Continue ASA 81 mg and Plavix 75 mg daily at least 1 year -Continue Benazepril 5 mg daily -Hold Carvedilol with low BP -Continue smoking cessation counseling  COPD: -continue Albuterol nebs prn and Spiriva daily  Psych: Takes Alprazolam, Quetiapine, Trazadone, and Ziprasidone at home -continue home antipsychotics, monitor respiratory status  DM: -Continue Lantus 40 units daily -Continue Novolog 10 units TIDAC -Continue SSI-moderate  Signed, Darreld McleanVishal Patel, MD  [facility] PGY-2 05/25/2016, 3:46 PM

## 2016-05-25 NOTE — Progress Notes (Signed)
CBG at 1200 was 401.  MD notified and pharmacy notified to reconcile home insulin.  New orders received. Will continue to monitor. [place], Mitzi Hansen

## 2016-05-25 NOTE — ED Provider Notes (Signed)
[facility]-EMERGENCY DEPT Provider Note   CSN: [id number] Arrival date & time: 05/25/16  0350     History   Chief Complaint Chief Complaint  Patient presents with  . Code STEMI    HPI Frank Powell is a 55 y.o. male.  The history is provided by the patient.  Chest Pain   This is a new problem. The current episode started 6 to 12 hours ago. The problem occurs constantly. The problem has been gradually improving. The pain is present in the substernal region. The pain is severe. Quality: heaviness. The pain radiates to the left arm. Associated symptoms include headaches and shortness of breath. Pertinent negatives include no abdominal pain.   Patient with h/o atrial fibrillation, h/o CAD presents in transfer from [facility] for concern for ACS/STEMI Pt reports onset of chest heaviness/shortness of breath at approximately 2200 He was seen at [facility] and noted to have STEMI He is transferred here for cardiac catheterization He reports he is having continued CP at this time  Past Medical History:  Diagnosis Date  . Atrial fib/flutter, transient   . Bipolar 1 disorder (HCC)   . COPD (chronic obstructive pulmonary disease) (HCC)   . Coronary artery disease   . DM2 (diabetes mellitus, type 2) (HCC) 06/02/2013  . GERD (gastroesophageal reflux disease)   . Hypertriglyceridemia 06/02/2013  . Multiple personality   . Myocardial infarct 03/21/2015   DES LAD  . Neurocardiogenic syncope 06/02/2013  . Obesity (BMI 30.0-34.9) 06/02/2013  . Pacemaker 06/02/2013    Patient Active Problem List   Diagnosis Date Noted  . Possible unstable angina pectoris (HCC) 08/26/2015  . Tobacco abuse 08/26/2015  . Type 2 diabetes mellitus with other circulatory complications 08/26/2015  . CAD S/P LAD DES 03/21/15 03/23/2015  . Bipolar affective disorder (HCC) 03/23/2015  . Non-compliance-left AMA 03/23/15 03/23/2015  . Cardiomyopathy, ischemic-EF 35-40% 03/23/2015  . ST elevation (STEMI) myocardial  infarction involving left anterior descending coronary artery (HCC) 03/21/2015  . Neurocardiogenic syncope 06/02/2013  . Pacemaker - Medtronic adaptadual-chamber implanted 2004, generator change 2014 06/02/2013  . DM2 (diabetes mellitus, type 2) (HCC) 06/02/2013  . Dyslipidemia 06/02/2013  . Prolonged QT interval 06/02/2013    Past Surgical History:  Procedure Laterality Date  . CARDIAC CATHETERIZATION  09/26/2002   normal coronary arteries and LV  . CARDIAC CATHETERIZATION N/A 03/21/2015   Procedure: Left Heart Cath and Coronary Angiography;  Surgeon: Lennette Bihari, MD; mLAD 100%, dLAD 80%, D2 80%, EF 50-55%  . CARDIAC CATHETERIZATION N/A 03/21/2015   Procedure: Coronary Stent Intervention;  Surgeon: Lennette Bihari, MD; 3.538 mm Xience Alpine DES to the LAD   . NM MYOCAR PERF WALL MOTION  10/06/2011   No ischemia  . PACEMAKER INSERTION Left 02/03/2003   Medtronic  . PERMANENT PACEMAKER GENERATOR CHANGE N/A 09/28/2012   Procedure: PERMANENT PACEMAKER GENERATOR CHANGE;  Surgeon: Thurmon Fair, MD; Medtronic        Home Medications    Prior to Admission medications   Medication Sig Start Date End Date Taking? Authorizing Provider  albuterol (PROVENTIL HFA;VENTOLIN HFA) 108 (90 BASE) MCG/ACT inhaler Inhale 1-2 puffs into the lungs every 6 (six) hours as needed for wheezing or shortness of breath.   Yes Historical Provider, MD  ALPRAZolam Prudy Feeler) 1 MG tablet Take 1 mg by mouth See admin instructions. Take 1 tablet 4 times daily and 2 tablets at bedtime as needed   Yes Historical Provider, MD  aspirin EC 81 MG EC tablet Take 1 tablet (  81 mg total) by mouth daily. 03/23/15  Yes Dwana MelenaBryan W Hager, PA-C  atorvastatin (LIPITOR) 80 MG tablet TAKE 1 TABLET DAILY AT 6 PM. 04/18/16  Yes Mihai Croitoru, MD  benazepril (LOTENSIN) 5 MG tablet Take 5 mg by mouth daily.   Yes Historical Provider, MD  BRILINTA 90 MG TABS tablet TAKE 1 TABLET 2 TIMES A DAY. 04/18/16  Yes Mihai Croitoru, MD  carvedilol (COREG)  12.5 MG tablet Take 1 tablet (12.5 mg total) by mouth 2 (two) times daily with a meal. KEEP OV. 02/23/16  Yes Mihai Croitoru, MD  gabapentin (NEURONTIN) 300 MG capsule Take 300-600 mg by mouth See admin instructions. Takes 1 capsule in am and at lunch and 2 capsules at night   Yes Historical Provider, MD  gemfibrozil (LOPID) 600 MG tablet Take 600 mg by mouth 2 (two) times daily before a meal.   Yes Historical Provider, MD  HUMALOG KWIKPEN 100 UNIT/ML KiwkPen Inject 30 Units into the skin daily.  08/21/15  Yes Historical Provider, MD  Insulin Degludec (TRESIBA FLEXTOUCH Burr) Inject into the skin daily.    Yes Historical Provider, MD  INVOKANA 100 MG TABS tablet Take 1 tablet by mouth daily. 08/21/15  Yes Historical Provider, MD  nitroGLYCERIN (NITROSTAT) 0.4 MG SL tablet Place 1 tablet (0.4 mg total) under the tongue every 5 (five) minutes x 3 doses as needed for chest pain. 03/23/15  Yes Dwana MelenaBryan W Hager, PA-C  QUEtiapine (SEROQUEL XR) 300 MG 24 hr tablet Take 600 mg by mouth at bedtime.   Yes Historical Provider, MD  ranitidine (ZANTAC) 150 MG tablet Take 1 tablet by mouth 2 (two) times daily as needed. 08/21/15  Yes Historical Provider, MD  tiotropium (SPIRIVA) 18 MCG inhalation capsule Place 18 mcg into inhaler and inhale daily.   Yes Historical Provider, MD  traZODone (DESYREL) 150 MG tablet Take 150-300 mg by mouth at bedtime as needed for sleep.    Yes Historical Provider, MD  ziprasidone (GEODON) 60 MG capsule Take 60 mg by mouth 2 (two) times daily with a meal.    Yes Historical Provider, MD  meclizine (ANTIVERT) 25 MG tablet Take 1 tablet (25 mg total) by mouth 3 (three) times daily as needed for dizziness. Patient not taking: Reported on 05/25/2016 12/17/15   Samuel JesterKathleen McManus, DO    Family History Family History  Problem Relation Age of Onset  . Coronary artery disease Father   . Diabetes Mother   . Diabetes Father     Social History Social History  Substance Use Topics  . Smoking status:  Current Every Day Smoker    Packs/day: 1.00    Types: Cigarettes  . Smokeless tobacco: Not on file  . Alcohol use No     Allergies   Shellfish allergy   Review of Systems Review of Systems  Respiratory: Positive for shortness of breath.   Cardiovascular: Positive for chest pain.  Gastrointestinal: Negative for abdominal pain.  Neurological: Positive for headaches.       Mild HA  All other systems reviewed and are negative.    Physical Exam Updated Vital Signs  Physical Exam CONSTITUTIONAL: chronically ill appearing HEAD: Normocephalic/atraumatic EYES: EOMI ENMT: Mucous membranes moist NECK: supple no meningeal signs CV: S1/S2 noted, no murmurs/rubs/gallops noted LUNGS: Lungs are clear to auscultation bilaterally, no apparent distress ABDOMEN: soft, nontender NEURO: Pt is awake/alert/appropriate, moves all extremitiesx4.   EXTREMITIES: pulses normal/equal SKIN: warm, color normal PSYCH: no abnormalities of mood noted, alert and oriented to situation  ED Treatments / Results  Labs (all labs ordered are listed, but only abnormal results are displayed) Labs Reviewed - No data to display  EKG  EKG Interpretation  Date/Time:  Wednesday May 25 2016 03:59:50 EST Ventricular Rate:  83 PR Interval:    QRS Duration: 93 QT Interval:  467 QTC Calculation: 549 R Axis:   62 Text Interpretation:  Sinus or ectopic atrial rhythm Prolonged PR interval Anteroseptal infarct, age indeterminate Prolonged QT interval ST elevation in inferior leads noted Abnormal ekg changed from prior Confirmed by Bebe Shaggy  MD, Junnie Loschiavo ([id number]) on 05/25/2016 4:15:16 AM       Radiology No results found.  Procedures Procedures  CRITICAL CARE Performed by: Joya Gaskins Total critical care time: 30 minutes Critical care time was exclusive of separately billable procedures and treating other patients. Critical care was necessary to treat or prevent imminent or life-threatening  deterioration. Critical care was time spent personally by me on the following activities: development of treatment plan with patient and/or surrogate as well as nursing, discussions with consultants, evaluation of patient's response to treatment, examination of patient, obtaining history from patient or surrogate, ordering and performing treatments and interventions, ordering and review of laboratory studies, ordering and review of radiographic studies, pulse oximetry and re-evaluation of patient's condition. PATIENT WITH STEMI THAT REQUIRED TRANSFER TO CARDIAC CATH LAB.   Medications Ordered in ED Medications - No data to display   Initial Impression / Assessment and Plan / ED Course  I have reviewed the triage vital signs and the nursing notes.  Pertinent labs from outside hospital results that were available during my care of the patient were reviewed by me and considered in my medical decision making (see chart for details).  Clinical Course     Pt seen on arrival in transfer He has been given ASA/Plavix/heparin On labs from outside hospital he had elevated troponin and mild hypokalemia Pt currently reporting CP He is going emergently to cardiac catheterization lab Cardiology fellow now in the ED   Final Clinical Impressions(s) / ED Diagnoses   Final diagnoses:  ST elevation myocardial infarction (STEMI), unspecified artery [facility])    New Prescriptions New Prescriptions   No medications on file     Zadie Rhine, MD 05/25/16 0425

## 2016-05-26 ENCOUNTER — Inpatient Hospital Stay (HOSPITAL_COMMUNITY): Payer: Medicare Other

## 2016-05-26 DIAGNOSIS — I213 ST elevation (STEMI) myocardial infarction of unspecified site: Secondary | ICD-10-CM

## 2016-05-26 LAB — CBC
HCT: 36.3 % — ABNORMAL LOW (ref 39.0–52.0)
Hemoglobin: 11.9 g/dL — ABNORMAL LOW (ref 13.0–17.0)
MCH: 30.5 pg (ref 26.0–34.0)
MCHC: 32.8 g/dL (ref 30.0–36.0)
MCV: 93.1 fL (ref 78.0–100.0)
Platelets: 356 10*3/uL (ref 150–400)
RBC: 3.9 MIL/uL — ABNORMAL LOW (ref 4.22–5.81)
RDW: 15 % (ref 11.5–15.5)
WBC: 20.7 10*3/uL — ABNORMAL HIGH (ref 4.0–10.5)

## 2016-05-26 LAB — BASIC METABOLIC PANEL
Anion gap: 8 (ref 5–15)
BUN: 12 mg/dL (ref 6–20)
CO2: 28 mmol/L (ref 22–32)
Calcium: 8 mg/dL — ABNORMAL LOW (ref 8.9–10.3)
Chloride: 101 mmol/L (ref 101–111)
Creatinine, Ser: 0.94 mg/dL (ref 0.61–1.24)
GFR calc Af Amer: >60 mL/min (ref >60)
GFR calc non Af Amer: >60 mL/min (ref >60)
Glucose, Bld: 149 mg/dL — ABNORMAL HIGH (ref 65–99)
Potassium: 3.2 mmol/L — ABNORMAL LOW (ref 3.5–5.1)
Sodium: 137 mmol/L (ref 135–145)

## 2016-05-26 LAB — ECHOCARDIOGRAM COMPLETE
FS: 30 % (ref 28–44)
Height: 70 in
IVS/LV PW RATIO, ED: 0.86
LA ID, A-P, ES: 41 mm
LA diam end sys: 41 mm
LA diam index: 1.92 cm/m2
LA vol A4C: 54.7 ml
LA vol index: 29.3 mL/m2
LA vol: 62.4 mL
LV PW d: 11.3 mm — AB (ref 0.6–1.1)
LVOT area: 3.8 cm2
LVOT diameter: 22 mm
Lateral S' vel: 7 cm/s
TAPSE: 16 mm
Weight: 3348.8 oz

## 2016-05-26 LAB — MAGNESIUM: Magnesium: 2 mg/dL (ref 1.7–2.4)

## 2016-05-26 LAB — POTASSIUM: Potassium: 4 mmol/L (ref 3.5–5.1)

## 2016-05-26 LAB — LIPID PANEL
Cholesterol: 139 mg/dL (ref 0–200)
HDL: 34 mg/dL — ABNORMAL LOW (ref >40)
LDL Cholesterol: 78 mg/dL (ref 0–99)
Total CHOL/HDL Ratio: 4.1 RATIO
Triglycerides: 133 mg/dL (ref <150)
VLDL: 27 mg/dL (ref 0–40)

## 2016-05-26 LAB — GLUCOSE, CAPILLARY
Glucose-Capillary: 302 mg/dL — ABNORMAL HIGH (ref 65–99)
Glucose-Capillary: 198 mg/dL — ABNORMAL HIGH (ref 65–99)
Glucose-Capillary: 218 mg/dL — ABNORMAL HIGH (ref 65–99)

## 2016-05-26 LAB — TROPONIN I
Troponin I: 12.25 ng/mL (ref <0.03)
Troponin I: 9.08 ng/mL (ref <0.03)
Troponin I: 8.45 ng/mL (ref <0.03)

## 2016-05-26 MED ORDER — ACETAMINOPHEN 325 MG PO TABS: 650.0000 mg | ORAL_TABLET | ORAL | Status: DC | PRN

## 2016-05-26 MED ORDER — POTASSIUM CHLORIDE CRYS ER 20 MEQ PO TBCR
40.0000 meq | EXTENDED_RELEASE_TABLET | Freq: Once | ORAL | Status: AC
  Administered 2016-05-26: 40 meq via ORAL
  Filled 2016-05-26: qty 2

## 2016-05-26 MED ORDER — METOPROLOL TARTRATE 25 MG PO TABS: 25.0000 mg | ORAL_TABLET | Freq: Two times a day (BID) | ORAL | Status: DC

## 2016-05-26 MED ORDER — CLOPIDOGREL BISULFATE 75 MG PO TABS: 75.0000 mg | ORAL_TABLET | Freq: Every day | ORAL | 3 refills | Status: DC

## 2016-05-26 MED ORDER — METOPROLOL TARTRATE 25 MG PO TABS: 25.0000 mg | ORAL_TABLET | Freq: Two times a day (BID) | ORAL | 3 refills | Status: DC

## 2016-05-26 MED ORDER — ENOXAPARIN SODIUM 40 MG/0.4ML ~~LOC~~ SOLN
40.0000 mg | SUBCUTANEOUS | Status: DC
  Administered 2016-05-26: 40 mg via SUBCUTANEOUS
  Filled 2016-05-26: qty 0.4

## 2016-05-26 MED ORDER — POTASSIUM CHLORIDE CRYS ER 20 MEQ PO TBCR: 40.0000 meq | EXTENDED_RELEASE_TABLET | Freq: Two times a day (BID) | ORAL | Status: DC

## 2016-05-26 MED FILL — Lidocaine HCl Local Preservative Free (PF) Inj 1%: INTRAMUSCULAR | Qty: 30 | Status: AC

## 2016-05-26 NOTE — Progress Notes (Signed)
Patient Name: Frank Powell Date of Encounter: 05/26/2016  Primary Cardiologist: Dr. Rachelle [facility]oraMihai [facility]  Hospital Problem List     Active Problems:   Acute MI, inferior wall [facility](HCC)     Subjective   Patient feels well this morning. He denies any chest pain or shortness of breath. Blood pressure dropped overnight to 67/51 while asleep.   Inpatient Medications    Scheduled Meds: . ALPRAZolam  1 mg Oral QID  . aspirin  81 mg Oral Daily  . atorvastatin  80 mg Oral q1800  . benazepril  5 mg Oral Daily  . clopidogrel  75 mg Oral Q breakfast  . famotidine  10 mg Oral BID  . gabapentin  300 mg Oral BID WC  . gabapentin  600 mg Oral QHS  . insulin aspart  0-15 Units Subcutaneous TID WC  . insulin aspart  10 Units Subcutaneous TID WC  . insulin glargine  40 Units Subcutaneous Daily  . mouth rinse  15 mL Mouth Rinse BID  . pneumococcal 23 valent vaccine  0.5 mL Intramuscular Tomorrow-1000  . potassium chloride  40 mEq Oral BID  . QUEtiapine  600 mg Oral QHS  . sodium chloride flush  3 mL Intravenous Q12H  . tiotropium  18 mcg Inhalation Daily  . ziprasidone  60 mg Oral BID WC   Continuous Infusions: . sodium chloride 50 mL/hr at 05/26/16 0700   PRN Meds: sodium chloride, acetaminophen, albuterol, ALPRAZolam, nitroGLYCERIN, ondansetron (ZOFRAN) IV, sodium chloride flush, traZODone   Vital Signs    Vitals:   05/26/16 0530 05/26/16 0600 05/26/16 0700 05/26/16 0726  BP: (!) 92/57 93/75 94/69  (!) 95/46  Pulse: 88 89 87   Resp: (!) 26 (!) 24 (!) 25 10  Temp:    97.6 F (36.4 C)  TempSrc:    Oral  SpO2: 97% 96% 94% 95%  Weight:      Height:        Intake/Output Summary (Last 24 hours) at 05/26/16 0841 Last data filed at 05/26/16 0700  Gross per 24 hour  Intake          2985.63 ml  Output              740 ml  Net          2245.63 ml   Filed Weights   05/25/16 0531  Weight: 209 lb 4.8 oz (94.9 kg)    Physical Exam    GEN: Well nourished, well developed, in no acute  distress.  HEENT: Grossly normal.  Neck: Supple, no JVD. Cardiac: Distant heart sounds, RRR, no murmurs, rubs, or gallops. No clubbing, cyanosis, edema.  Radials/DP 2+ and equal bilaterally.  Respiratory:  Distant breath sounds, coarse expiratory wheezing bilaterally GI: Soft, nontender, nondistended. MS: no deformity or atrophy. Skin: warm and dry, no rash. Psych: AAOx3.  Normal affect.  Labs    CBC  Recent Labs  05/26/16 0245  WBC 20.7*  HGB 11.9*  HCT 36.3*  MCV 93.1  PLT 356   Basic Metabolic Panel  Recent Labs  05/25/16 0914 05/26/16 0245  NA 134* 137  K 3.2* 3.2*  CL 97* 101  CO2 24 28  GLUCOSE 338* 149*  BUN 8 12  CREATININE 0.93 0.94  CALCIUM 8.0* 8.0*   Liver Function Tests No results for input(s): AST, ALT, ALKPHOS, BILITOT, PROT, ALBUMIN in the last 72 hours. No results for input(s): LIPASE, AMYLASE in the last 72 hours. Cardiac Enzymes  Recent Labs  05/25/16 1245 05/25/16 1816 05/26/16 0245  TROPONINI 19.85* 15.80* 12.25*   BNP Invalid input(s): POCBNP D-Dimer No results for input(s): DDIMER in the last 72 hours. Hemoglobin A1C No results for input(s): HGBA1C in the last 72 hours. Fasting Lipid Panel  Recent Labs  05/26/16 0245  CHOL 139  HDL 34*  LDLCALC 78  TRIG 916  CHOLHDL 4.1   Thyroid Function Tests No results for input(s): TSH, T4TOTAL, T3FREE, THYROIDAB in the last 72 hours.  Invalid input(s): FREET3  Telemetry    Sinus rhythm with 1st degree AV block to paced rhythm, back in sinus rhythm - Personally Reviewed  ECG    Ventricular paced rhythm - Personally Reviewed  Radiology    No results found.  Cardiac Studies   LHC, Coronary Angiography, Coronary Stent Intervention 05/25/2016:  LV end diastolic pressure is mildly elevated. LVEDP 18 mm Hg.  Ramus lesion, 80 %stenosed.  Ost 1st Mrg to 1st Mrg lesion, 75 %stenosed.  Ost 2nd Diag lesion, 80 %stenosed. Small vessel.  Mid RCA lesion, 99 %stenosed. A STENT  SYNERGY DES 3X28 drug eluting stent was successfully placed and postdilated to 3.5 mm.  Post intervention, there is a 0% residual stenosis.  Ost RPDA lesion, 80 %stenosed.   Patient Profile     Pt is a 55 yo M w/ a PMH significant for 1vCAD c/b STEMI mid-LAD, afib/flutter, neurocardiogenic syncope s/p DC PPM, poorly controlled DMII, COPD, and psych p/w CP.  Assessment & Plan    CAD with Inferior STEMI: Subtle ST elevation in inferior leads, patient taken emergently to cath lab with lesions noted as above. DES successfully placed to mid RCA (99% stenosis) with 0% residual stenosis. Troponin peak of 19.85, trending down to 12.25.  -TTE pending -Continue Atorvastatin 80 mg daily -Continue ASA 81 mg and Plavix 75 mg daily at least 1 year -Continue Benazepril 5 mg daily -Hold Carvedilol with low BP -Continue smoking cessation counseling  Hypokalemia: K 3.2 this morning. -Replete with KDur 40 mEq x 2  COPD: -continue Albuterol nebs prn and Spiriva daily  Psych: Takes Alprazolam, Quetiapine, Trazadone, and Ziprasidone at home -continue home antipsychotics, monitor respiratory status  DM: -Continue Lantus 40 units daily -Continue Novolog 10 units TIDAC -Continue SSI-moderate  Signed, Darreld Mclean, MD  [facility] PGY-2 05/26/2016, 8:41 AM   History and all data above reviewed.  Patient examined.  I agree with the findings as above.  The patient denies any chest pain.  No SOB.    The patient exam reveals COR:RRR  ,  Lungs: Clear  ,  Abd: Positive bowel sounds, no rebound no guarding, Ext No edema  .  All available labs, radiology testing, previous records reviewed. Agree with documented assessment and plan. Inferior MI.  Hypotension last night. Given fluid. Hold ACE inhibitor today and resume before discharge.  Probably home in AM.    Rollene Rotunda  8:59 AM  05/26/2016

## 2016-05-26 NOTE — Progress Notes (Signed)
Pt has asked to be discharged this evening since he feels that he is doing well and is supposed to be discharged in the morning. PA from cardiology was paged and he came to assess the pt. Per his assessment pt needs to stay overnight. Pt is insisting on leaving today. He signed the AMA form and has called is brother to come pick him up.  Colleen Can, RN

## 2016-05-26 NOTE — Discharge Summary (Addendum)
Patient ID: Frank Powell,  MRN: [id number], DOB/AGE: 55/12/1960 55 y.o.  Admit date: 05/25/2016 Discharge date: 05/26/2016  Primary Care Provider: Toma DeitersXAJE A HASANAJ, MD Primary Cardiologist: Dr Royann Shiversroitoru  Discharge Diagnoses Principal Problem:   ST elevation (STEMI) myocardial infarction involving left anterior descending coronary artery [facility](HCC) Active Problems:   CAD S/P LAD DES 03/21/15   DM2 (diabetes mellitus, type 2) (HCC)   Bipolar affective disorder (HCC)   Non-compliance-left AMA 05/26/16   Cardiomyopathy, ischemic-EF 35-40%   Pacemaker - Medtronic adaptadual-chamber implanted 2004, generator change 2014   Dyslipidemia    Procedures: Urgent Cath/ PCI 05/25/16   Hospital Course: Pt is a 55 yo obese, Caucasin male w/ a PMH significant for, CAD s/p  LAD DES Sept 2016 (left AMA that admission), pacemaker 2014, poorly controlled DMII, COPD, ICM with an EF of 35-40%, and bipolar disorder on multiple medications. His LOV was in Feb 2017 for a pacer check. He presented 4am 05/25/16 as a transfer from an outside facility where he presented with chest pain, diaphoresis and hypotension. His symptoms were similar to her pre MI symptoms in Sept 2016. The pt had stopped Ticagrelor Sept 2017. His EKG was paced. He was taken urgently to the cath lab. Previous LAD stent was patent. The pt had an 80% Dx2, 80% RI, 75% OM, and 99% mid RCA which was treated with PCI and a DES. His EF by echo was 55-60% 05/26/16. Troponin peaked at 19. He was transferred to telemetry. On the evening of 11/9 he told the RN he wanted to go home. I came and spoke with the pt. He was a little SOB at rest. HR 110. I suggested he stay. He prefers to sign out AMA. I changed his Coreg to Metoprolol at discharge. He'll be contacted for follow up.  Discharge Vitals:  Blood pressure 118/83, pulse (!) 105, temperature 97.4 F (36.3 C), temperature source Oral, resp. rate (!) 31, height 5\' 10"  (1.778 m), weight 209 lb 4.8 oz (94.9 kg),  SpO2 96 %.    Labs: Results for orders placed or performed during the hospital encounter of 05/25/16 (from the past 24 hour(s))  Glucose, capillary     Status: Abnormal   Collection Time: 05/25/16  9:48 PM  Result Value Ref Range   Glucose-Capillary 149 (H) 65 - 99 mg/dL   Comment 1 Capillary Specimen   Lipid panel     Status: Abnormal   Collection Time: 05/26/16  2:45 AM  Result Value Ref Range   Cholesterol 139 0 - 200 mg/dL   Triglycerides 130133 <865<150 mg/dL   HDL 34 (L) >78>40 mg/dL   Total CHOL/HDL Ratio 4.1 RATIO   VLDL 27 0 - 40 mg/dL   LDL Cholesterol 78 0 - 99 mg/dL  Basic metabolic panel     Status: Abnormal   Collection Time: 05/26/16  2:45 AM  Result Value Ref Range   Sodium 137 135 - 145 mmol/L   Potassium 3.2 (L) 3.5 - 5.1 mmol/L   Chloride 101 101 - 111 mmol/L   CO2 28 22 - 32 mmol/L   Glucose, Bld 149 (H) 65 - 99 mg/dL   BUN 12 6 - 20 mg/dL   Creatinine, Ser 4.690.94 0.61 - 1.24 mg/dL   Calcium 8.0 (L) 8.9 - 10.3 mg/dL   GFR calc non Af Amer >60 >60 mL/min   GFR calc Af Amer >60 >60 mL/min   Anion gap 8 5 - 15  CBC  Status: Abnormal   Collection Time: 05/26/16  2:45 AM  Result Value Ref Range   WBC 20.7 (H) 4.0 - 10.5 K/uL   RBC 3.90 (L) 4.22 - 5.81 MIL/uL   Hemoglobin 11.9 (L) 13.0 - 17.0 g/dL   HCT 97.7 (L) 41.4 - 23.9 %   MCV 93.1 78.0 - 100.0 fL   MCH 30.5 26.0 - 34.0 pg   MCHC 32.8 30.0 - 36.0 g/dL   RDW 53.2 02.3 - 34.3 %   Platelets 356 150 - 400 K/uL  Troponin I (q 6hr x 3)     Status: Abnormal   Collection Time: 05/26/16  2:45 AM  Result Value Ref Range   Troponin I 12.25 (HH) <0.03 ng/mL  Glucose, capillary     Status: Abnormal   Collection Time: 05/26/16  7:16 AM  Result Value Ref Range   Glucose-Capillary 302 (H) 65 - 99 mg/dL  Troponin I (q 6hr x 3)     Status: Abnormal   Collection Time: 05/26/16  8:11 AM  Result Value Ref Range   Troponin I 9.08 (HH) <0.03 ng/mL  Glucose, capillary     Status: Abnormal   Collection Time: 05/26/16 11:58  AM  Result Value Ref Range   Glucose-Capillary 198 (H) 65 - 99 mg/dL   Comment 1 Capillary Specimen   Troponin I (q 6hr x 3)     Status: Abnormal   Collection Time: 05/26/16  2:39 PM  Result Value Ref Range   Troponin I 8.45 (HH) <0.03 ng/mL  Potassium     Status: None   Collection Time: 05/26/16  2:39 PM  Result Value Ref Range   Potassium 4.0 3.5 - 5.1 mmol/L  Magnesium     Status: None   Collection Time: 05/26/16  2:39 PM  Result Value Ref Range   Magnesium 2.0 1.7 - 2.4 mg/dL  Glucose, capillary     Status: Abnormal   Collection Time: 05/26/16  3:51 PM  Result Value Ref Range   Glucose-Capillary 218 (H) 65 - 99 mg/dL    Disposition:  Follow-up Information    Thurmon Fair, MD Follow up.   Specialty:  Cardiology Why:  office will conatct you Contact information: [address] [place] [address] [phone]           Discharge Medications:    Medication List    STOP taking these medications   benazepril 5 MG tablet Commonly known as:  LOTENSIN   BRILINTA 90 MG Tabs tablet Generic drug:  ticagrelor   carvedilol 12.5 MG tablet Commonly known as:  COREG   gemfibrozil 600 MG tablet Commonly known as:  LOPID   INVOKANA 100 MG Tabs tablet Generic drug:  canagliflozin   meclizine 25 MG tablet Commonly known as:  ANTIVERT     TAKE these medications   acetaminophen 325 MG tablet Commonly known as:  TYLENOL Take 2 tablets (650 mg total) by mouth every 4 (four) hours as needed for headache or mild pain.   albuterol 108 (90 Base) MCG/ACT inhaler Commonly known as:  PROVENTIL HFA;VENTOLIN HFA Inhale 1-2 puffs into the lungs every 6 (six) hours as needed for wheezing or shortness of breath.   ALPRAZolam 1 MG tablet Commonly known as:  XANAX Take 1 mg by mouth See admin instructions. Take 1 tablet 4 times daily and 2 tablets at bedtime as needed   aspirin 81 MG EC tablet Take 1 tablet (81 mg total) by mouth daily.   atorvastatin 80  MG tablet Commonly known as:  LIPITOR TAKE 1 TABLET DAILY AT 6 PM.   clopidogrel 75 MG tablet Commonly known as:  PLAVIX Take 1 tablet (75 mg total) by mouth daily with breakfast. Start taking on:  05/27/2016   gabapentin 300 MG capsule Commonly known as:  NEURONTIN Take 300-600 mg by mouth See admin instructions. Takes 1 capsule in am and at lunch and 2 capsules at night   HUMALOG KWIKPEN 100 UNIT/ML KiwkPen Generic drug:  insulin lispro Inject 50 Units into the skin 3 (three) times daily.   metoprolol tartrate 25 MG tablet Commonly known as:  LOPRESSOR Take 1 tablet (25 mg total) by mouth 2 (two) times daily.   nitroGLYCERIN 0.4 MG SL tablet Commonly known as:  NITROSTAT Place 1 tablet (0.4 mg total) under the tongue every 5 (five) minutes x 3 doses as needed for chest pain.   QUEtiapine 300 MG 24 hr tablet Commonly known as:  SEROQUEL XR Take 600 mg by mouth at bedtime.   ranitidine 150 MG tablet Commonly known as:  ZANTAC Take 1 tablet by mouth 2 (two) times daily as needed.   tiotropium 18 MCG inhalation capsule Commonly known as:  SPIRIVA Place 18 mcg into inhaler and inhale daily.   traZODone 150 MG tablet Commonly known as:  DESYREL Take 150-300 mg by mouth at bedtime as needed for sleep.   TRESIBA FLEXTOUCH [place] Inject 80 Units into the skin daily.   ziprasidone 60 MG capsule Commonly known as:  GEODON Take 60 mg by mouth 2 (two) times daily with a meal.        Duration of Discharge Encounter: Greater than 30 minutes including physician time.  Jolene Provost PA-C 05/26/2016 6:24 PM

## 2016-05-26 NOTE — Discharge Instructions (Signed)
Coronary Angiogram With Stent, Care After °Refer to this sheet in the next few weeks. These instructions provide you with information about caring for yourself after your procedure. Your health care provider may also give you more specific instructions. Your treatment has been planned according to current medical practices, but problems sometimes occur. Call your health care provider if you have any problems or questions after your procedure. °WHAT TO EXPECT AFTER THE PROCEDURE  °After your procedure, it is typical to have the following: °· Bruising at the catheter insertion site that usually fades within 1-2 weeks. °· Blood collecting in the tissue (hematoma) that may be painful to the touch. It should usually decrease in size and tenderness within 1-2 weeks. °HOME CARE INSTRUCTIONS °· Take medicines only as directed by your health care provider. Blood thinners may be prescribed after your procedure to improve blood flow through the stent. °· You may shower 24-48 hours after the procedure or as directed by your health care provider. Remove the bandage (dressing) and gently wash the catheter insertion site with plain soap and water. Pat the area dry with a clean towel. Do not rub the site, because this may cause bleeding. °· Do not take baths, swim, or use a hot tub until your health care provider approves. °· Check your catheter insertion site every day for redness, swelling, or drainage. °· Do not apply powder or lotion to the site. °· Do not lift over 10 lb (4.5 kg) for 5 days after your procedure or as directed by your health care provider. °· Ask your health care provider when it is okay to: °¨ Return to work or school. °¨ Resume usual physical activities or sports. °¨ Resume sexual activity. °· Eat a heart-healthy diet. This should include plenty of fresh fruits and vegetables. Meat should be lean cuts. Avoid the following types of food: °¨ Food that is high in salt. °¨ Canned or highly processed food. °¨ Food  that is high in saturated fat or sugar. °¨ Fried food. °· Make any other lifestyle changes as recommended by your health care provider. These may include: °¨ Not using any tobacco products, including cigarettes, chewing tobacco, or electronic cigarettes. If you need help quitting, ask your health care provider. °¨ Managing your weight. °¨ Getting regular exercise. °¨ Managing your blood pressure. °¨ Limiting your alcohol intake. °¨ Managing other health problems, such as diabetes. °· If you need an MRI after your heart stent has been placed, be sure to tell the health care provider who orders the MRI that you have a heart stent. °· Keep all follow-up visits as directed by your health care provider. This is important. °SEEK MEDICAL CARE IF: °· You have a fever. °· You have chills. °· You have increased bleeding from the catheter insertion site. Hold pressure on the site. °SEEK IMMEDIATE MEDICAL CARE IF: °· You develop chest pain or shortness of breath, feel faint, or pass out. °· You have unusual pain at the catheter insertion site. °· You have redness, warmth, or swelling at the catheter insertion site. °· You have drainage (other than a small amount of blood on the dressing) from the catheter insertion site. °· The catheter insertion site is bleeding, and the bleeding does not stop after 30 minutes of holding steady pressure on the site. °· You develop bleeding from any other place, such as from your rectum. There may be bright red blood in your urine or stool, or it may appear as black, tarry stool. °  °  This information is not intended to replace advice given to you by your health care provider. Make sure you discuss any questions you have with your health care provider. °  °Document Released: 01/21/2005 Document Revised: 07/25/2014 Document Reviewed: 11/26/2012 °Elsevier Interactive Patient Education ©2016 [employer] ° °

## 2016-05-26 NOTE — Progress Notes (Signed)
  Echocardiogram 2D Echocardiogram has been performed.  Delcie Roch 05/26/2016, 10:37 AM

## 2016-05-26 NOTE — Progress Notes (Addendum)
MD paged to notify of pt rhythm change from 1st degree block to paced rhythm. 12 lead done, Troponin drawn. Will continue to monitor.

## 2016-05-26 NOTE — Progress Notes (Signed)
Inpatient Diabetes Program Recommendations  AACE/ADA: New Consensus Statement on Inpatient Glycemic Control (2015)  Target Ranges:  Prepandial:   less than 140 mg/dL      Peak postprandial:   less than 180 mg/dL (1-2 hours)      Critically ill patients:  140 - 180 mg/dL   Review of Glycemic Control  Diabetes history: DM2 Outpatient Diabetes medications: Tresiba 80 units daily+Novolog meal coverage (uncertain how many units ac meals)+Invokana 100 mg qd Current orders for Inpatient glycemic control: Lantus 40 units+Novolog 10 units tid meal coverage + Novolog correction 0-15 units tid   Inpatient Diabetes Program Recommendations:  Noted fasting CBGs elevated. Please consider: -Increase Lantus to 60 units daily -A1c to determine prehospital glycemic control  Thank you, Frank Powell. Frank Colasurdo, RN, MSN, CDE Inpatient Glycemic Control Team Team Pager [phone] (8am-5pm) 05/26/2016 7:35 AM

## 2016-05-26 NOTE — Progress Notes (Signed)
CARDIAC REHAB PHASE I   PRE:  Rate/Rhythm: 90 1HB SR  BP:  Supine:   Sitting: 104/70  Standing:    SaO2:   MODE:  Ambulation: 60 ft   POST:  Rate/Rhythm: 103 1HB ST bigeminy PVCS  BP:  Supine:   Sitting: 94/67  Standing:    SaO2:  0920-1002 Pt walked 60 ft with asst x 1 with unsteady gait. No CP. He cut walk short due to legs weak. Began ed, and tech in for 2D Echo. Will continue ed tomorrow. Reviewed NTG use, MI restrictions, importance of plavix with stent, and smoking cessation. Pt stated he quit smoking for 8 years but restarted due to stress. Gave pt fake cigarette and smoking cessation handout. He quit last time by cold [place]. Pt not sure at this time what his plan to quit will be. Encouraged him to call 1800quitnow as needed.   Luetta Nutting, RN BSN  05/26/2016 9:58 AM

## 2016-05-30 DIAGNOSIS — I213 ST elevation (STEMI) myocardial infarction of unspecified site: Secondary | ICD-10-CM | POA: Diagnosis not present

## 2016-06-03 DIAGNOSIS — E1159 Type 2 diabetes mellitus with other circulatory complications: Secondary | ICD-10-CM | POA: Diagnosis not present

## 2016-06-03 DIAGNOSIS — M62830 Muscle spasm of back: Secondary | ICD-10-CM | POA: Diagnosis not present

## 2016-07-08 DIAGNOSIS — E1159 Type 2 diabetes mellitus with other circulatory complications: Secondary | ICD-10-CM | POA: Diagnosis not present

## 2016-07-14 ENCOUNTER — Encounter: Payer: Medicare Other | Admitting: *Deleted

## 2016-07-14 ENCOUNTER — Telehealth: Payer: Self-pay | Admitting: Cardiology

## 2016-07-14 NOTE — Telephone Encounter (Signed)
Spoke with pt and reminded pt of remote transmission that is due today. Pt verbalized understanding and said he didn't have a monitor. Ordered pt a new monitor. He will send transmission once he gets the new monitor.

## 2016-07-15 ENCOUNTER — Encounter: Payer: Self-pay | Admitting: Cardiology

## 2016-08-19 ENCOUNTER — Other Ambulatory Visit: Payer: Self-pay | Admitting: Cardiovascular Disease

## 2016-10-04 ENCOUNTER — Encounter: Payer: Self-pay | Admitting: Cardiovascular Disease

## 2016-10-04 ENCOUNTER — Ambulatory Visit (INDEPENDENT_AMBULATORY_CARE_PROVIDER_SITE_OTHER): Payer: Medicare Other | Admitting: Cardiovascular Disease

## 2016-10-04 VITALS — BP 115/78 | HR 81 | Ht 70.0 in | Wt 219.0 lb

## 2016-10-04 DIAGNOSIS — Z720 Tobacco use: Secondary | ICD-10-CM | POA: Diagnosis not present

## 2016-10-04 DIAGNOSIS — E785 Hyperlipidemia, unspecified: Secondary | ICD-10-CM | POA: Diagnosis not present

## 2016-10-04 DIAGNOSIS — Z794 Long term (current) use of insulin: Secondary | ICD-10-CM | POA: Diagnosis not present

## 2016-10-04 DIAGNOSIS — R9431 Abnormal electrocardiogram [ECG] [EKG]: Secondary | ICD-10-CM

## 2016-10-04 DIAGNOSIS — Z950 Presence of cardiac pacemaker: Secondary | ICD-10-CM

## 2016-10-04 DIAGNOSIS — E1159 Type 2 diabetes mellitus with other circulatory complications: Secondary | ICD-10-CM | POA: Diagnosis not present

## 2016-10-04 DIAGNOSIS — I480 Paroxysmal atrial fibrillation: Secondary | ICD-10-CM | POA: Diagnosis not present

## 2016-10-04 DIAGNOSIS — I25758 Atherosclerosis of native coronary artery of transplanted heart with other forms of angina pectoris: Secondary | ICD-10-CM | POA: Diagnosis not present

## 2016-10-04 DIAGNOSIS — I255 Ischemic cardiomyopathy: Secondary | ICD-10-CM | POA: Diagnosis not present

## 2016-10-04 DIAGNOSIS — R55 Syncope and collapse: Secondary | ICD-10-CM

## 2016-10-04 NOTE — Patient Instructions (Signed)
Dr Royann Shivers recommends that you continue on your current medications as directed. Please refer to the Current Medication list given to you today.  Remote monitoring is used to monitor your Pacemaker of ICD from home. This monitoring reduces the number of office visits required to check your device to one time per year. It allows [place] to keep an eye on the functioning of your device to ensure it is working properly. You are scheduled for a device check from home on Tuesday, June 19th, 2018. You may send your transmission at any time that day. If you have a wireless device, the transmission will be sent automatically. After your physician reviews your transmission, you will receive a postcard with your next transmission date.  Dr Royann Shivers recommends that you schedule a follow-up appointment in 6 months with a pacemaker check. You will receive a reminder letter in the mail two months in advance. If you don't receive a letter, please call our office to schedule the follow-up appointment.  If you need a refill on your cardiac medications before your next appointment, please call your pharmacy.

## 2016-10-04 NOTE — Progress Notes (Signed)
Patient ID: Frank Powell, male   DOB: Aug 07, 1960, 57 y.o.   MRN: [id number]     Cardiology Office Note    Date:  10/04/2016   ID:  Frank Powell, DOB [birth year], MRN [id number]  PCP:  Toma Deiters, MD  Cardiologist:   Thurmon Fair, MD   No chief complaint on file.   History of Present Illness:  Frank Powell is a 56 y.o. male with a history of premature onset coronary artery disease, Returning roughly 3 months after an acute inferior wall ST segment elevation myocardial infarction treated with emergency PCI (drug-eluting stent Synergy3 28 mm) to the mid right coronary artery. He denies problems with shortness of breath or exertional angina since then. Roughly once a week he'll wake up with nocturnal angina that is promptly relieved with a single sublingual nitroglycerin.  He has previously had ST segment elevation anterior wall myocardial infarction in September 2016 treated with placement of a drug-eluting stent in the mid LAD artery (3.538 mm Xience Alpine ). His most recent cardiac catheterization showed residual 80% stenoses in the ostium of the ramus intermedius artery, small first oblique marginal artery, small second diagonal artery, ostium of the posterior descending artery. It was felt that the ramus intermedius would be an appropriate target for PCI for ongoing symptoms.  He continues to smoke cigarettes (although he claims he has cut back to a half ppd), has poorly controlled diabetes mellitus (he drinks a lot of Dr. Reino Kent) and a history of noncompliance (possibly related to bipolar disorder). He is taking clopidogrel and aspirin "everyday".  Left ventricular ejection fraction was 55-60% following his most recent infarction, although wall motion abnormalities were described in the apex in the basal inferior wall.  He has not had any problems with shortness of breath at rest or with activity. Denies palpitations, syncope, leg edema and claudication. He has not had lower  extremity edema, claudication or focal neurological complaints.   His electrocardiogram today shows sinus rhythm with Q waves in the septal leads and nonspecific T-wave changes. QTC intervals prolonged at 482 ms, likely due to his psychotropic medications  He has a history of neurocardiogenic/cardioinhibitory syncope that has largely resolved following implantation of a dual-chamber permanent pacemaker in 2014. Device interrogation shows normal function, 10-14 years of generator longevity remain. He only has 0.3% atrial pacing and 0.7% ventricular pacing, with a device set at a lower rate limit of 50 bpm.  he had a 12 minutes episode of atrial fibrillation that occurred on November 10, shortly after his last myocardial infarction. Otherwise, his pacemaker has not recorded high atrial rates or high ventricular rates.    Past Medical History:  Diagnosis Date  . Atrial fib/flutter, transient   . Bipolar 1 disorder (HCC)   . COPD (chronic obstructive pulmonary disease) (HCC)   . Coronary artery disease   . DM2 (diabetes mellitus, type 2) (HCC) 06/02/2013  . GERD (gastroesophageal reflux disease)   . Hypertriglyceridemia 06/02/2013  . Multiple personality   . Myocardial infarct 03/21/2015   DES LAD  . Neurocardiogenic syncope 06/02/2013  . Obesity (BMI 30.0-34.9) 06/02/2013  . Pacemaker 06/02/2013    Past Surgical History:  Procedure Laterality Date  . CARDIAC CATHETERIZATION  09/26/2002   normal coronary arteries and LV  . CARDIAC CATHETERIZATION N/A 03/21/2015   Procedure: Left Heart Cath and Coronary Angiography;  Surgeon: Lennette Bihari, MD; mLAD 100%, dLAD 80%, D2 80%, EF 50-55%  . CARDIAC CATHETERIZATION N/A 03/21/2015   Procedure:  Coronary Stent Intervention;  Surgeon: Lennette Bihari, MD; 3.538 mm Xience Alpine DES to the LAD   . CARDIAC CATHETERIZATION N/A 05/25/2016   Procedure: Left Heart Cath and Coronary Angiography;  Surgeon: Corky Crafts, MD;  Location[facility] INVASIVE CV LAB;   Service: Cardiovascular;  Laterality: N/A;  . CARDIAC CATHETERIZATION N/A 05/25/2016   Procedure: Coronary Stent Intervention;  Surgeon: Corky Crafts, MD;  Location: [facility] INVASIVE CV LAB;  Service: Cardiovascular;  Laterality: N/A;  . NM MYOCAR PERF WALL MOTION  10/06/2011   No ischemia  . PACEMAKER INSERTION Left 02/03/2003   Medtronic  . PERMANENT PACEMAKER GENERATOR CHANGE N/A 09/28/2012   Procedure: PERMANENT PACEMAKER GENERATOR CHANGE;  Surgeon: Thurmon Fair, MD; Medtronic     Outpatient Medications Prior to Visit  Medication Sig Dispense Refill  . acetaminophen (TYLENOL) 325 MG tablet Take 2 tablets (650 mg total) by mouth every 4 (four) hours as needed for headache or mild pain.    Marland Kitchen albuterol (PROVENTIL HFA;VENTOLIN HFA) 108 (90 BASE) MCG/ACT inhaler Inhale 1-2 puffs into the lungs every 6 (six) hours as needed for wheezing or shortness of breath.    . ALPRAZolam (XANAX) 1 MG tablet Take 1 mg by mouth See admin instructions. Take 1 tablet 4 times daily and 2 tablets at bedtime as needed    . aspirin EC 81 MG EC tablet Take 1 tablet (81 mg total) by mouth daily.    Marland Kitchen atorvastatin (LIPITOR) 80 MG tablet TAKE 1 TABLET DAILY AT 6 PM. 30 tablet 11  . clopidogrel (PLAVIX) 75 MG tablet Take 1 tablet (75 mg total) by mouth daily with breakfast. 90 tablet 3  . gabapentin (NEURONTIN) 300 MG capsule Take 300-600 mg by mouth See admin instructions. Takes 1 capsule in am and at lunch and 2 capsules at night    . HUMALOG KWIKPEN 100 UNIT/ML KiwkPen Inject 50 Units into the skin 3 (three) times daily.     . Insulin Degludec (TRESIBA FLEXTOUCH ) Inject 80 Units into the skin daily.     . metoprolol tartrate (LOPRESSOR) 25 MG tablet Take 1 tablet (25 mg total) by mouth 2 (two) times daily. 180 tablet 3  . nitroGLYCERIN (NITROSTAT) 0.4 MG SL tablet Place 1 tablet (0.4 mg total) under the tongue every 5 (five) minutes x 3 doses as needed for chest pain. 25 tablet 12  . QUEtiapine (SEROQUEL XR) 300  MG 24 hr tablet Take 600 mg by mouth at bedtime.    . ranitidine (ZANTAC) 150 MG tablet Take 1 tablet by mouth 2 (two) times daily as needed.    . tiotropium (SPIRIVA) 18 MCG inhalation capsule Place 18 mcg into inhaler and inhale daily.    . traZODone (DESYREL) 150 MG tablet Take 150-300 mg by mouth at bedtime as needed for sleep.     . ziprasidone (GEODON) 60 MG capsule Take 60 mg by mouth 2 (two) times daily with a meal.      No facility-administered medications prior to visit.      Allergies:   Shellfish allergy   Social History   Social History  . Marital status: Divorced    Spouse name: N/A  . Number of children: N/A  . Years of education: N/A   Occupational History  . Disabled    Social History Main Topics  . Smoking status: Current Every Day Smoker    Packs/day: 1.00    Types: Cigarettes  . Smokeless tobacco: Never Used  . Alcohol use No  .  Drug use: No  . Sexual activity: Not on file   Other Topics Concern  . Not on file   Social History Narrative   Lives in [place], [place].     Family History:  The patient's family history includes Coronary artery disease in his father; Diabetes in his father and mother.   ROS:   Please see the history of present illness.    ROS All other systems reviewed and are negative.   PHYSICAL EXAM:   VS:  BP 115/78   Pulse 81   Ht 5\' 10"  (1.778 m)   Wt 99.3 kg (219 lb)   BMI 31.42 kg/m    GEN: Well nourished, well developed, in no acute distress  HEENT: normal  Neck: no JVD, carotid bruits, or masses Cardiac: RRR; no murmurs, rubs, or gallops,no edema ; healthy left subclavian pacemaker site Respiratory:  clear to auscultation bilaterally, normal work of breathing GI: soft, nontender, nondistended, + BS MS: no deformity or atrophy  Skin: warm and dry, no rash Neuro:  Alert and Oriented x 3, Strength and sensation are intact Psych: euthymic mood, full affect  Wt Readings from Last 3 Encounters:  10/04/16 99.3 kg (219 lb)    05/25/16 94.9 kg (209 lb 4.8 oz)  04/13/16 91.8 kg (202 lb 6.4 oz)      Studies/Labs Reviewed:   EKG:  EKG is ordered today.  The ekg ordered today demonstrates normal sinus rhythm, QS pattern in V1-V2, nonspecific T-wave abnormality, QTC 482 ms  Recent Labs: 05/26/2016: BUN 12; Creatinine, Ser 0.94; Hemoglobin 11.9; Magnesium 2.0; Platelets 356; Potassium 4.0; Sodium 137   Lipid Panel    Component Value Date/Time   CHOL 139 05/26/2016 0245   TRIG 133 05/26/2016 0245   HDL 34 (L) 05/26/2016 0245   CHOLHDL 4.1 05/26/2016 0245   VLDL 27 05/26/2016 0245   LDLCALC 78 05/26/2016 0245     ASSESSMENT:    1. Coronary artery disease involving native artery of transplanted heart with other forms of angina pectoris (HCC)   2. Paroxysmal atrial fibrillation (HCC)   3. Neurocardiogenic syncope   4. Pacemaker   5. Dyslipidemia   6. Tobacco abuse   7. Type 2 diabetes mellitus with other circulatory complication, with long-term current use of insulin (HCC)   8. Prolonged QT interval      PLAN:  In order of problems listed above:   1. CAD: roughly 3 months s/p Inferior STEMI and drug-eluting stent to mid RCA and 2 years s/p anterior STEMI and LAD stent. He has occasional nocturnal angina that is promptly relieved by sublingual nitroglycerin. He has numerous secondary branches significant stenoses and continues to smoke. It's conceivable that he has occasional vasospasm. I did not add any antianginals at today's visit. Reinforced the need for dual antiplatelet therapy at least through this November. I think he should remain on lifelong aspirin and clopidogrel since it doesn't seem that he will stop smoking and his diabetes remains very poorly controlled. There is a very high likelihood of disease progression and future need for revascularization.  He is reminded to seek emergency medical attention for chest pain lasting more than 25-30 minutes. He is taking ACE inhibitors and carvedilol and  does not require loop diuretic therapy. Encouraged him to stay well hydrated to avoid his symptoms of orthostatic hypotension and reduce the likelihood of neurally mediated syncope.  2. AFib: He had a single 12 minute episode of paroxysmal atrial fibrillation that occurred very soon after his  last myocardial infarction. I don't think he is a good candidate for long-term anticoagulation due to his problems with compliance, especially since he currently needs dual antiplatelet therapy. We'll continue to monitor for atrial fibrillation recurrence via his pacemaker. 3. Neurally mediated syncope: No recurrence since pacemaker implantation 4. PPM: Normal device function. Infrequent pacing is limited to sudden bradycardia response episodes. We did get him a new remote transmitter and need to reestablish downloads every 3 months. We'll have our device clinic nurses walking through his transmission in 3 months and see him back in the office in November. 5. HLP: LDL cholesterol levels were close to target when last checked.  6. Tobacco abuse: Discussed smoking cessation at length. He is convinced he cannot quit smoking, but is willing to try to slowly cut back. 7. DM, poorly controlled: reminded him this increases the likelihood of progression of coronary disease. Encouraged him to stop drinking sodas completely and try flavored non-sweetened water instead. 8. Long QT interval due to medications: Acceptable QT at today's visit 9. Bipolar disorder: Note that he is taking several medications that could prolong the QT interval. Cautious monitoring and avoid any other drugs that can cause similar QT prolongation.    Medication Adjustments/Labs and Tests Ordered: Current medicines are reviewed at length with the patient today.  Concerns regarding medicines are outlined above.  Medication changes, Labs and Tests ordered today are listed in the Patient Instructions below. Patient Instructions  Dr Royann Shivers recommends  that you continue on your current medications as directed. Please refer to the Current Medication list given to you today.  Remote monitoring is used to monitor your Pacemaker of ICD from home. This monitoring reduces the number of office visits required to check your device to one time per year. It allows [place] to keep an eye on the functioning of your device to ensure it is working properly. You are scheduled for a device check from home on Tuesday, June 19th, 2018. You may send your transmission at any time that day. If you have a wireless device, the transmission will be sent automatically. After your physician reviews your transmission, you will receive a postcard with your next transmission date.  Dr Royann Shivers recommends that you schedule a follow-up appointment in 6 months with a pacemaker check. You will receive a reminder letter in the mail two months in advance. If you don't receive a letter, please call our office to schedule the follow-up appointment.  If you need a refill on your cardiac medications before your next appointment, please call your pharmacy.      Signed, Thurmon Fair, MD  10/04/2016 6:10 PM    [facility] [address] [place], [place], [place]  [address] Phone: [phone]; Fax: [phone]

## 2016-10-05 ENCOUNTER — Encounter: Payer: Medicare Other | Admitting: Cardiovascular Disease

## 2016-10-06 LAB — CUP PACEART INCLINIC DEVICE CHECK
Battery Impedance: 206 Ohm
Battery Remaining Longevity: 147 mo
Battery Voltage: 2.79 V
Brady Statistic AP VP Percent: 0 %
Brady Statistic AP VS Percent: 0 %
Brady Statistic AS VP Percent: 0 %
Brady Statistic AS VS Percent: 99 %
Date Time Interrogation Session: 20180320185821
Implantable Lead Implant Date: 20040719
Implantable Lead Implant Date: 20040719
Implantable Lead Location: 753859
Implantable Lead Location: 753860
Implantable Lead Model: 5076
Implantable Lead Model: 5076
Implantable Pulse Generator Implant Date: 20140314
Lead Channel Impedance Value: 561 Ohm
Lead Channel Impedance Value: 563 Ohm
Lead Channel Pacing Threshold Amplitude: 0.625 V
Lead Channel Pacing Threshold Amplitude: 1.375 V
Lead Channel Pacing Threshold Pulse Width: 0.4 ms
Lead Channel Pacing Threshold Pulse Width: 0.4 ms
Lead Channel Sensing Intrinsic Amplitude: 2.8 mV
Lead Channel Sensing Intrinsic Amplitude: 8 mV
Lead Channel Setting Pacing Amplitude: 1.5 V
Lead Channel Setting Pacing Amplitude: 2.75 V
Lead Channel Setting Pacing Pulse Width: 0.4 ms
Lead Channel Setting Sensing Sensitivity: 4 mV

## 2016-10-11 DIAGNOSIS — Z0000 Encounter for general adult medical examination without abnormal findings: Secondary | ICD-10-CM | POA: Diagnosis not present

## 2016-10-11 DIAGNOSIS — E1159 Type 2 diabetes mellitus with other circulatory complications: Secondary | ICD-10-CM | POA: Diagnosis not present

## 2016-10-11 DIAGNOSIS — Z1389 Encounter for screening for other disorder: Secondary | ICD-10-CM | POA: Diagnosis not present

## 2016-10-28 DIAGNOSIS — J449 Chronic obstructive pulmonary disease, unspecified: Secondary | ICD-10-CM | POA: Diagnosis not present

## 2016-10-28 DIAGNOSIS — Z794 Long term (current) use of insulin: Secondary | ICD-10-CM | POA: Diagnosis not present

## 2016-10-28 DIAGNOSIS — I25119 Atherosclerotic heart disease of native coronary artery with unspecified angina pectoris: Secondary | ICD-10-CM | POA: Diagnosis not present

## 2016-10-28 DIAGNOSIS — E876 Hypokalemia: Secondary | ICD-10-CM | POA: Diagnosis not present

## 2016-10-28 DIAGNOSIS — Z8249 Family history of ischemic heart disease and other diseases of the circulatory system: Secondary | ICD-10-CM | POA: Diagnosis not present

## 2016-10-28 DIAGNOSIS — R079 Chest pain, unspecified: Secondary | ICD-10-CM | POA: Diagnosis not present

## 2016-10-28 DIAGNOSIS — Z7902 Long term (current) use of antithrombotics/antiplatelets: Secondary | ICD-10-CM | POA: Diagnosis not present

## 2016-10-28 DIAGNOSIS — Z79899 Other long term (current) drug therapy: Secondary | ICD-10-CM | POA: Diagnosis not present

## 2016-10-28 DIAGNOSIS — I25118 Atherosclerotic heart disease of native coronary artery with other forms of angina pectoris: Secondary | ICD-10-CM | POA: Diagnosis not present

## 2016-10-28 DIAGNOSIS — I10 Essential (primary) hypertension: Secondary | ICD-10-CM | POA: Diagnosis not present

## 2016-10-28 DIAGNOSIS — K219 Gastro-esophageal reflux disease without esophagitis: Secondary | ICD-10-CM | POA: Diagnosis not present

## 2016-10-28 DIAGNOSIS — Z955 Presence of coronary angioplasty implant and graft: Secondary | ICD-10-CM | POA: Diagnosis not present

## 2016-10-28 DIAGNOSIS — I252 Old myocardial infarction: Secondary | ICD-10-CM | POA: Diagnosis not present

## 2016-10-28 DIAGNOSIS — Z91013 Allergy to seafood: Secondary | ICD-10-CM | POA: Diagnosis not present

## 2016-10-28 DIAGNOSIS — Z950 Presence of cardiac pacemaker: Secondary | ICD-10-CM | POA: Diagnosis not present

## 2016-10-28 DIAGNOSIS — M79602 Pain in left arm: Secondary | ICD-10-CM | POA: Diagnosis not present

## 2016-10-28 DIAGNOSIS — R05 Cough: Secondary | ICD-10-CM | POA: Diagnosis not present

## 2016-10-28 DIAGNOSIS — E1165 Type 2 diabetes mellitus with hyperglycemia: Secondary | ICD-10-CM | POA: Diagnosis not present

## 2016-10-29 DIAGNOSIS — E1165 Type 2 diabetes mellitus with hyperglycemia: Secondary | ICD-10-CM | POA: Diagnosis not present

## 2016-10-29 DIAGNOSIS — I10 Essential (primary) hypertension: Secondary | ICD-10-CM | POA: Diagnosis not present

## 2016-10-29 DIAGNOSIS — I25118 Atherosclerotic heart disease of native coronary artery with other forms of angina pectoris: Secondary | ICD-10-CM | POA: Diagnosis not present

## 2016-10-29 DIAGNOSIS — K219 Gastro-esophageal reflux disease without esophagitis: Secondary | ICD-10-CM | POA: Diagnosis not present

## 2016-11-11 DIAGNOSIS — I208 Other forms of angina pectoris: Secondary | ICD-10-CM | POA: Diagnosis not present

## 2016-11-11 DIAGNOSIS — Z125 Encounter for screening for malignant neoplasm of prostate: Secondary | ICD-10-CM | POA: Diagnosis not present

## 2016-11-11 DIAGNOSIS — Z0000 Encounter for general adult medical examination without abnormal findings: Secondary | ICD-10-CM | POA: Diagnosis not present

## 2016-12-08 DIAGNOSIS — E1165 Type 2 diabetes mellitus with hyperglycemia: Secondary | ICD-10-CM | POA: Diagnosis not present

## 2016-12-08 DIAGNOSIS — I10 Essential (primary) hypertension: Secondary | ICD-10-CM | POA: Diagnosis not present

## 2016-12-08 DIAGNOSIS — Z79899 Other long term (current) drug therapy: Secondary | ICD-10-CM | POA: Diagnosis not present

## 2016-12-08 DIAGNOSIS — E1142 Type 2 diabetes mellitus with diabetic polyneuropathy: Secondary | ICD-10-CM | POA: Diagnosis not present

## 2016-12-08 DIAGNOSIS — M545 Low back pain: Secondary | ICD-10-CM | POA: Diagnosis not present

## 2016-12-08 DIAGNOSIS — L03111 Cellulitis of right axilla: Secondary | ICD-10-CM | POA: Diagnosis not present

## 2016-12-20 DIAGNOSIS — Z79899 Other long term (current) drug therapy: Secondary | ICD-10-CM | POA: Diagnosis not present

## 2016-12-20 DIAGNOSIS — Z720 Tobacco use: Secondary | ICD-10-CM | POA: Diagnosis not present

## 2017-01-03 ENCOUNTER — Encounter: Payer: Medicare Other | Admitting: *Deleted

## 2017-01-03 ENCOUNTER — Telehealth: Payer: Self-pay | Admitting: Cardiology

## 2017-01-03 NOTE — Telephone Encounter (Signed)
Spoke with pt and reminded pt of remote transmission that is due today. Pt verbalized understanding.   

## 2017-01-06 ENCOUNTER — Encounter: Payer: Self-pay | Admitting: Cardiology

## 2017-01-06 ENCOUNTER — Encounter: Payer: Medicare Other | Admitting: Internal Medicine

## 2017-01-17 DIAGNOSIS — I10 Essential (primary) hypertension: Secondary | ICD-10-CM | POA: Diagnosis not present

## 2017-01-17 DIAGNOSIS — M545 Low back pain: Secondary | ICD-10-CM | POA: Diagnosis not present

## 2017-01-17 DIAGNOSIS — R5383 Other fatigue: Secondary | ICD-10-CM | POA: Diagnosis not present

## 2017-01-17 DIAGNOSIS — E1142 Type 2 diabetes mellitus with diabetic polyneuropathy: Secondary | ICD-10-CM | POA: Diagnosis not present

## 2017-02-23 DIAGNOSIS — L03111 Cellulitis of right axilla: Secondary | ICD-10-CM | POA: Diagnosis not present

## 2017-04-20 DIAGNOSIS — I10 Essential (primary) hypertension: Secondary | ICD-10-CM | POA: Diagnosis not present

## 2017-04-20 DIAGNOSIS — E782 Mixed hyperlipidemia: Secondary | ICD-10-CM | POA: Diagnosis not present

## 2017-04-20 DIAGNOSIS — J441 Chronic obstructive pulmonary disease with (acute) exacerbation: Secondary | ICD-10-CM | POA: Diagnosis not present

## 2017-04-20 DIAGNOSIS — E1142 Type 2 diabetes mellitus with diabetic polyneuropathy: Secondary | ICD-10-CM | POA: Diagnosis not present

## 2017-04-25 ENCOUNTER — Other Ambulatory Visit: Payer: Self-pay | Admitting: Cardiovascular Disease

## 2017-04-29 IMAGING — CT CT HEAD W/O CM
3 of 4 series · 15 of 47 positions shown, 18 images · non-contrast
Comparison: Brain CT 10/16/2003

CLINICAL DATA: Patient status post fall 12/16/2015. No reported
loss consciousness.

EXAM:
CT HEAD WITHOUT CONTRAST
TECHNIQUE: Contiguous axial images were obtained from the base of the skull
through the vertex without intravenous contrast.

[Series 2: head w/o · axial · non-contrast · 0.43mm/px · z∈[+94,+234]mm · 9 of 41 slices shown, 12 images]
[im 3/41  brain]
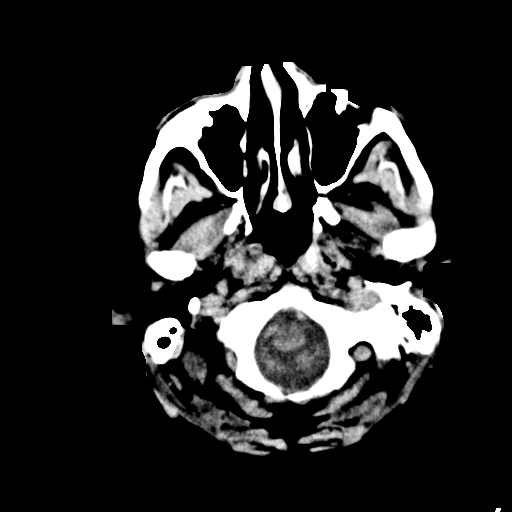
[im 3/41  bone]
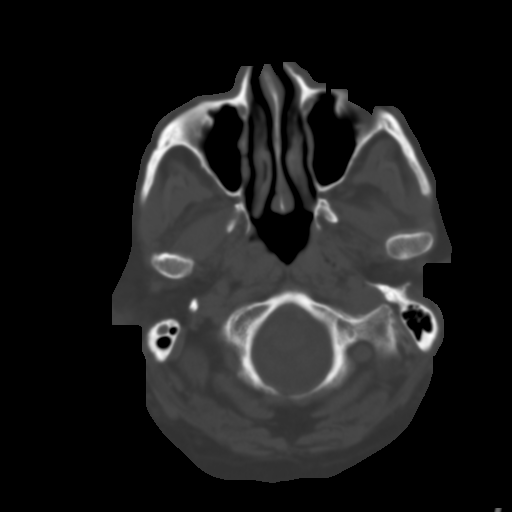
[im 9/41  brain]
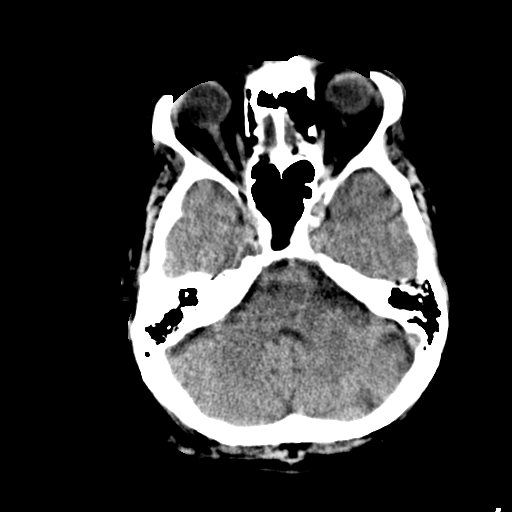
[im 12/41  brain]
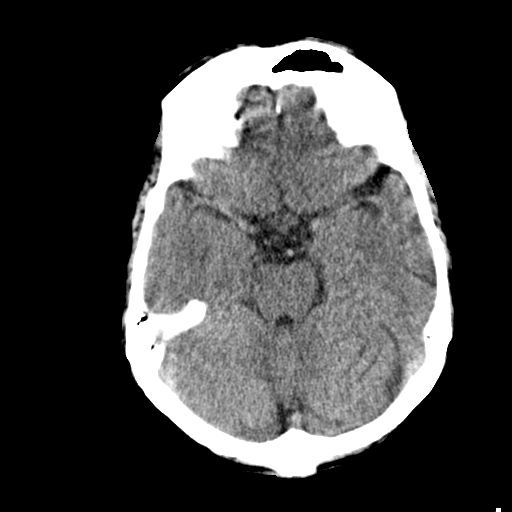
[im 18/41  brain]
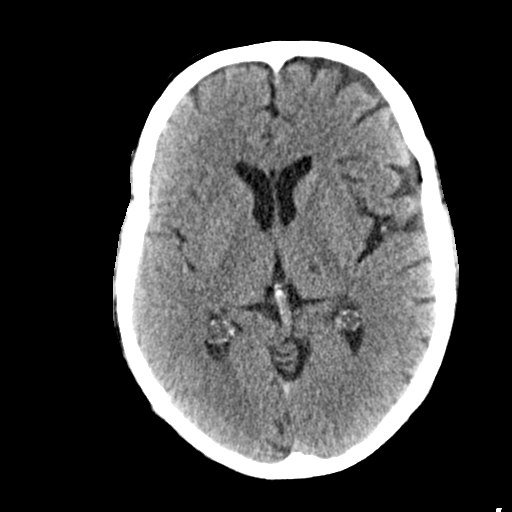
[im 21/41  brain]
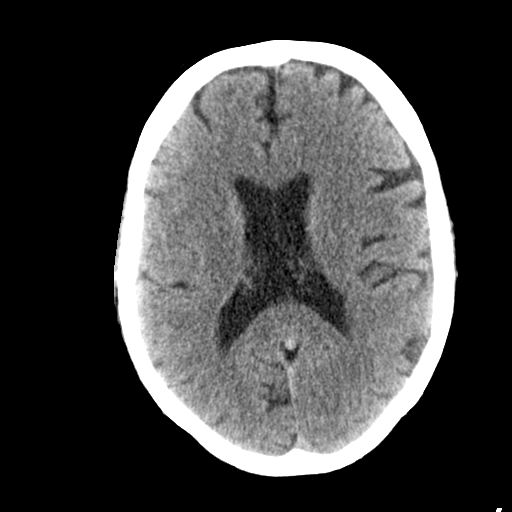
[im 21/41  bone]
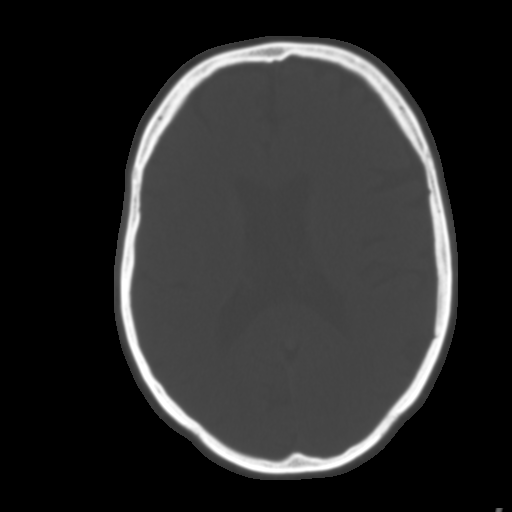
[im 23/41  brain]
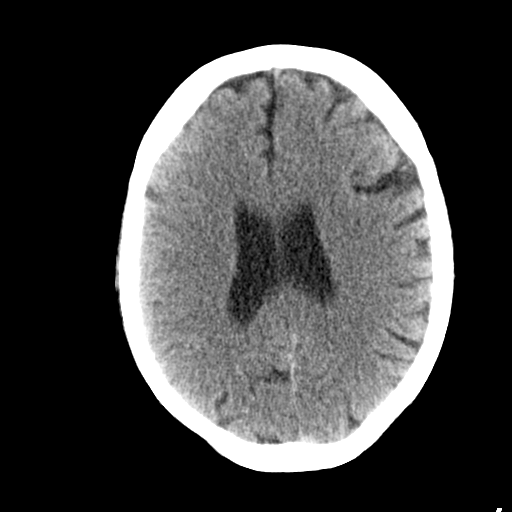
[im 29/41  brain]
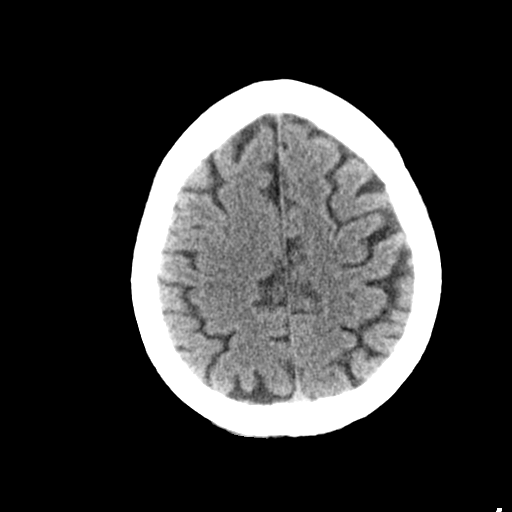
[im 32/41  brain]
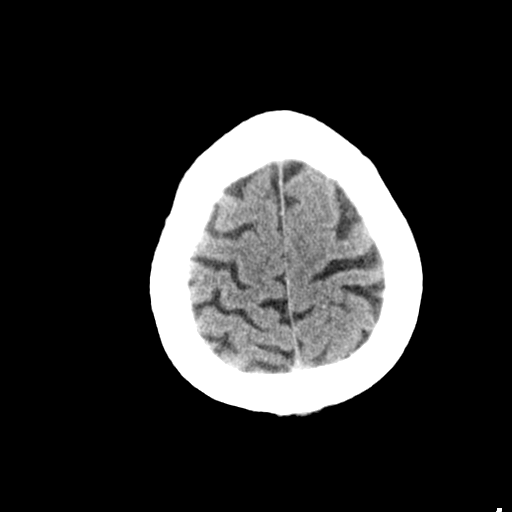
[im 38/41  brain]
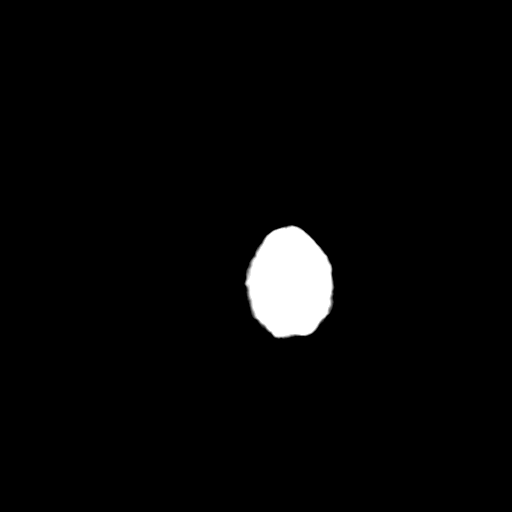
[im 38/41  bone]
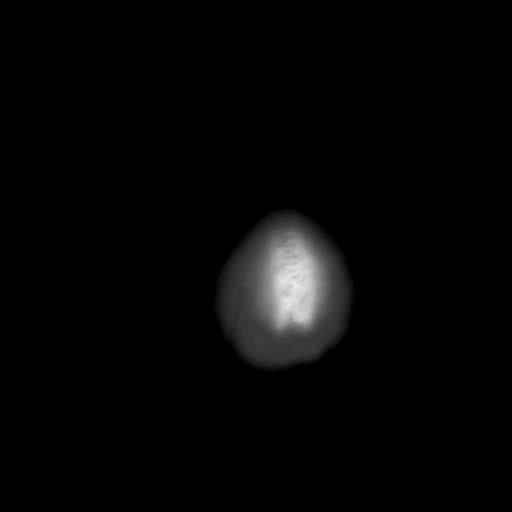

[Series 4: coronal · coronal · 0.32mm/px · 3 of 67 slices shown]
[im 23/67  brain]
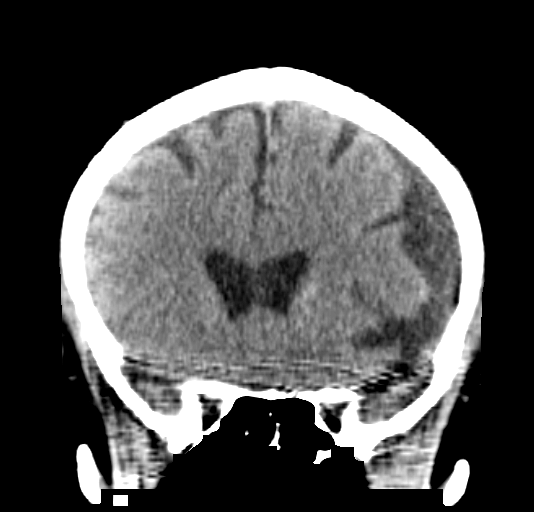
[im 30/67  brain]
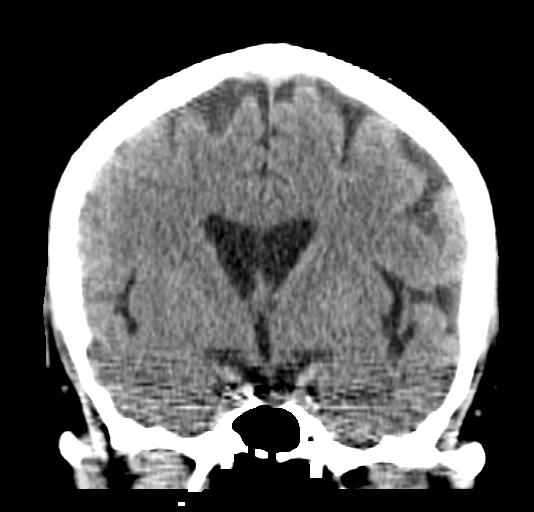
[im 37/67  brain]
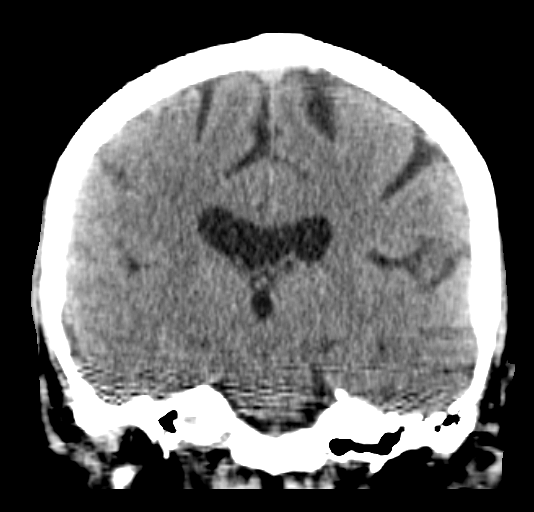

[Series 5: sagittal · sagittal · 0.32mm/px · 3 of 54 slices shown]
[im 18/54  brain]
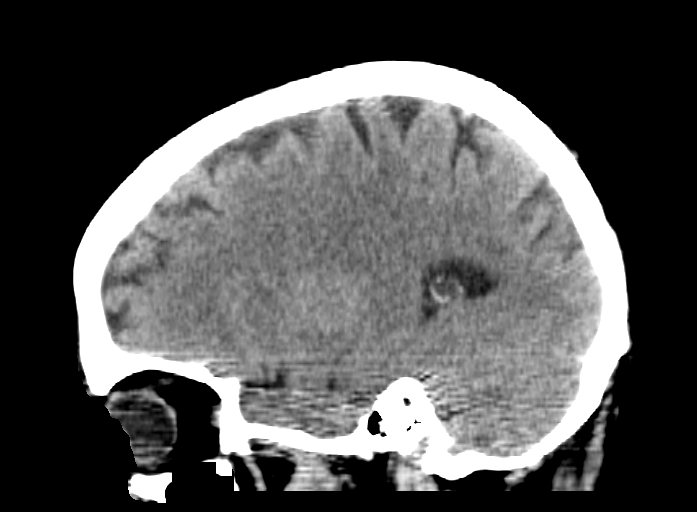
[im 27/54  brain]
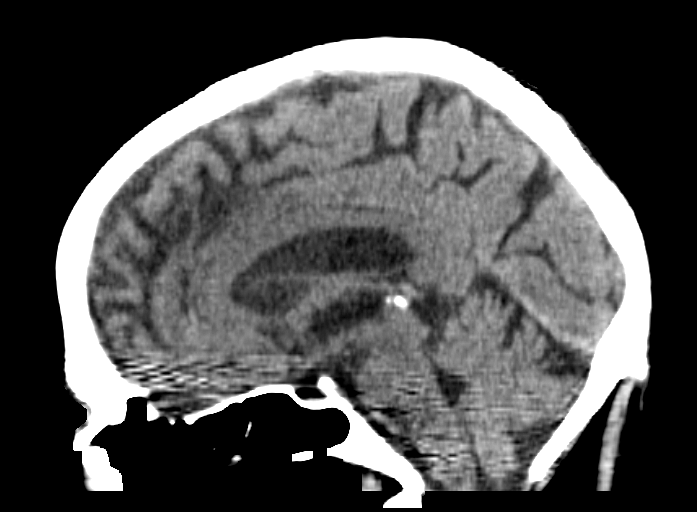
[im 36/54  brain]
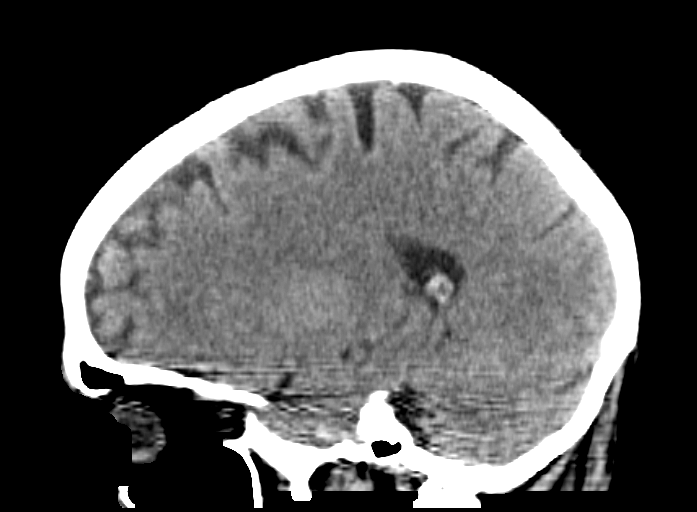

[15 of 47 positions shown; findings below may reference images not displayed]

FINDINGS: Ventricles and sulci are appropriate for patient's age. Old left
basal ganglia lacunar infarct. No evidence for acute cortically
based infarct, intracranial hemorrhage, mass lesion or mass-effect.
Orbits are unremarkable. Paranasal sinuses are well aerated. Mastoid
air cells are unremarkable. Calvarium is intact.
IMPRESSION: No acute intracranial process.

## 2017-04-29 IMAGING — DX DG CHEST 2V
2 series · 2 of 2 positions shown · non-contrast
Comparison: PA and lateral chest 09/05/2015 and 09/25/2012.

CLINICAL DATA: Lightheadedness yesterday. Sensation of spinning for
2 months. No known injury. Initial encounter.

EXAM:
CHEST  2 VIEW

[chest pa]
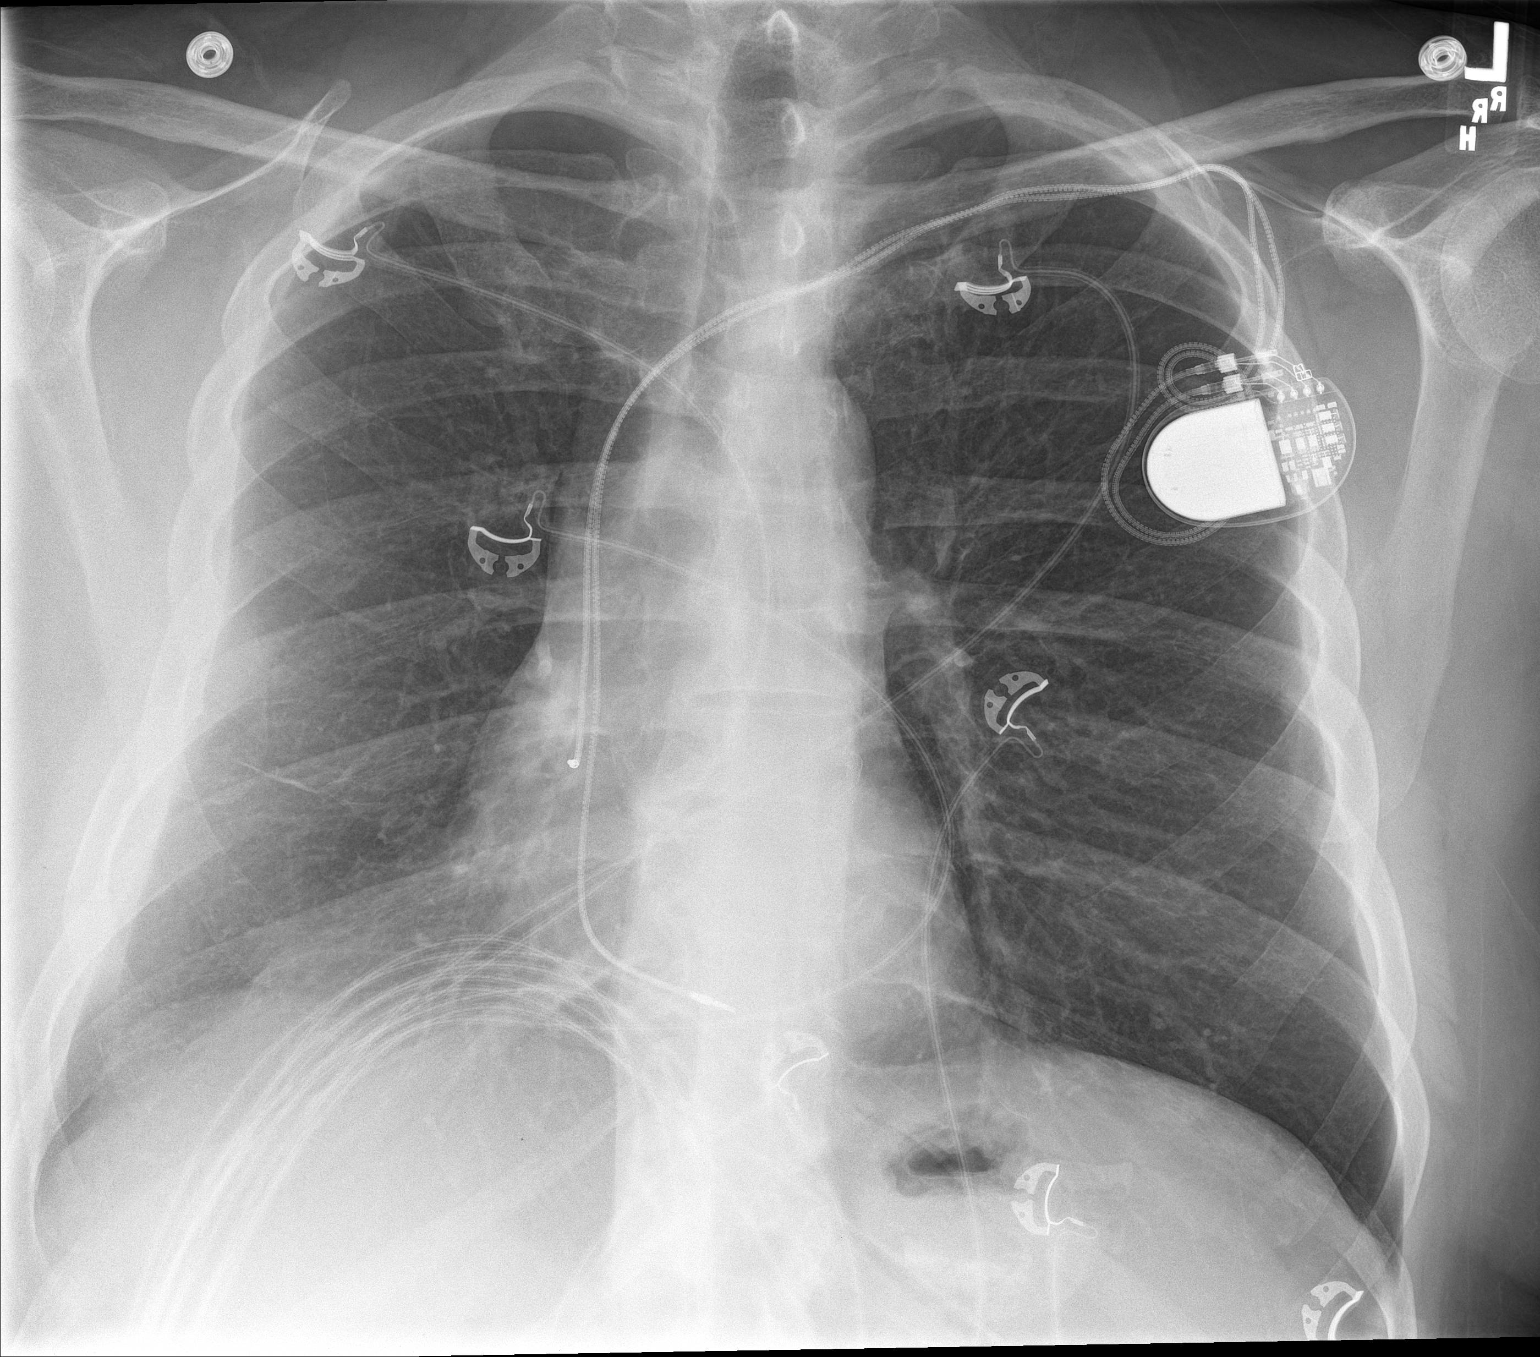

[chest lat]
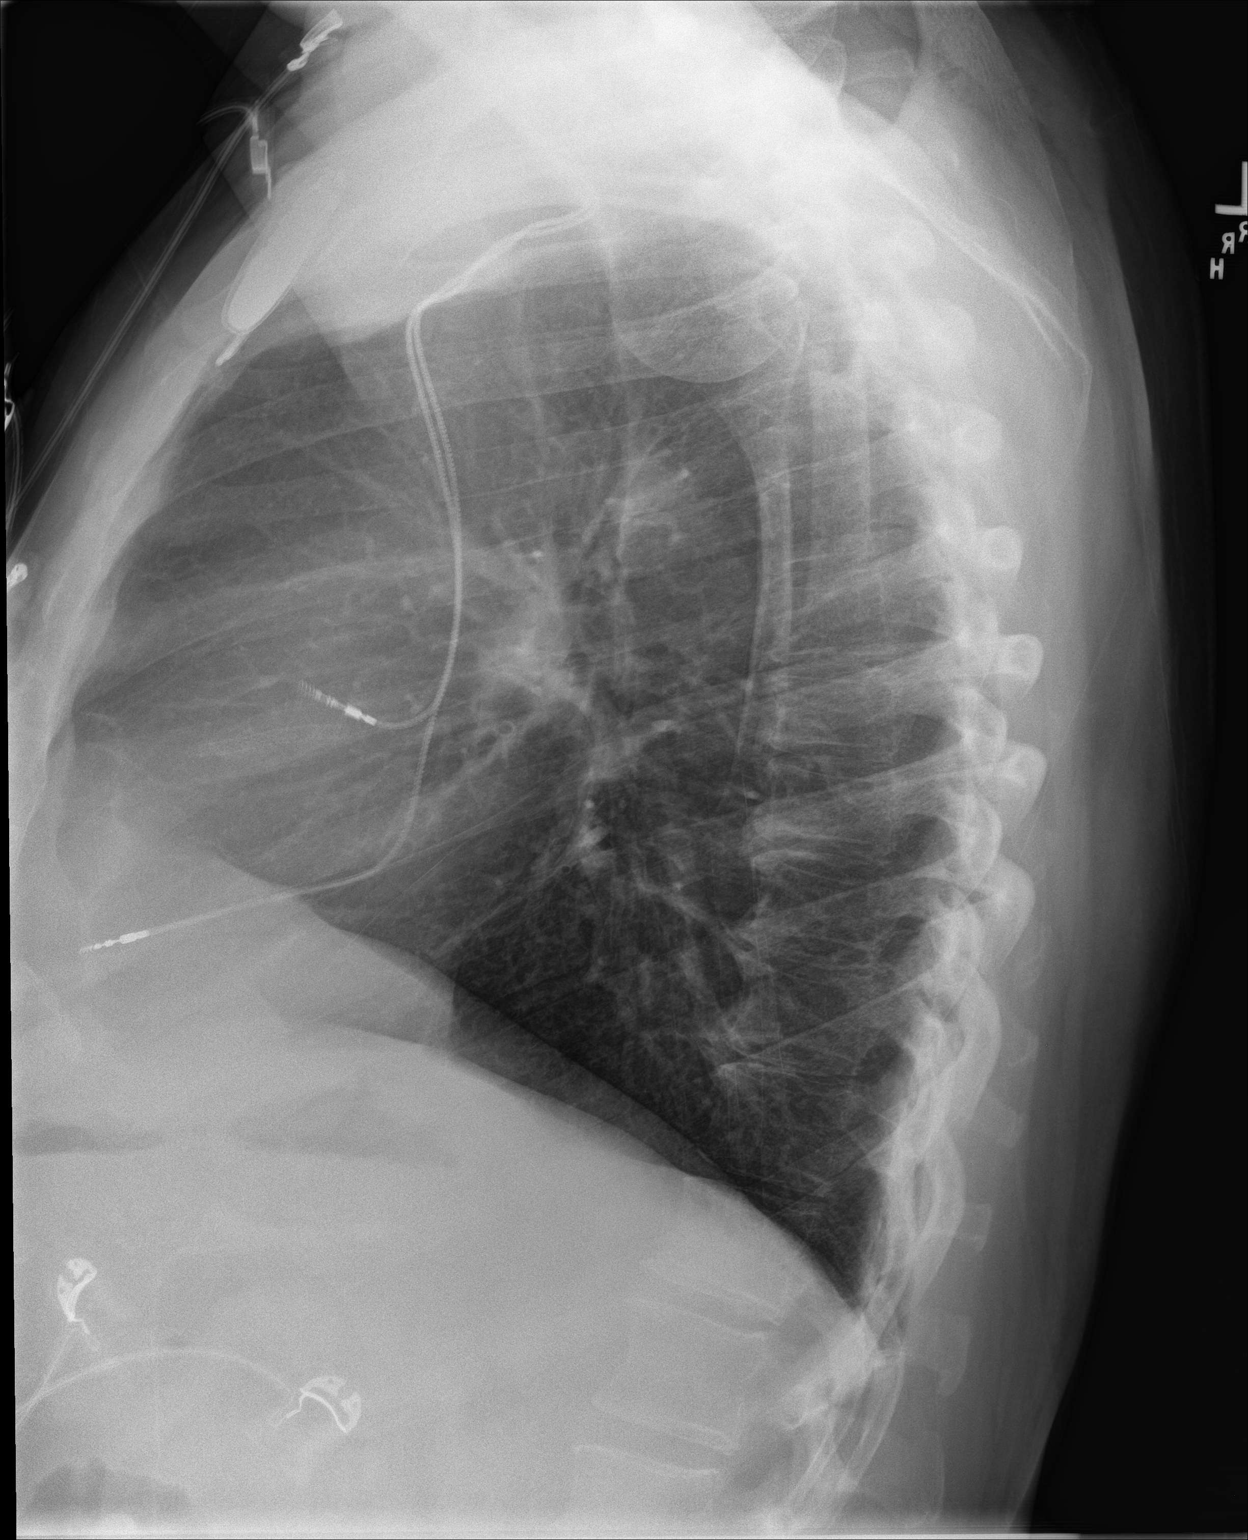

[2 of 2 positions shown; findings below may reference images not displayed]

FINDINGS: Pacing device is unchanged. Lungs are clear. Heart size is normal.
No pneumothorax or pleural effusion.
IMPRESSION: No acute disease.

## 2017-05-04 DIAGNOSIS — E1165 Type 2 diabetes mellitus with hyperglycemia: Secondary | ICD-10-CM | POA: Diagnosis not present

## 2017-05-19 ENCOUNTER — Other Ambulatory Visit: Payer: Self-pay | Admitting: Cardiovascular Disease

## 2017-05-30 ENCOUNTER — Encounter: Payer: Medicare Other | Admitting: Cardiovascular Disease

## 2017-06-22 ENCOUNTER — Other Ambulatory Visit: Payer: Self-pay | Admitting: Cardiovascular Disease

## 2017-07-07 DIAGNOSIS — E1142 Type 2 diabetes mellitus with diabetic polyneuropathy: Secondary | ICD-10-CM | POA: Diagnosis not present

## 2017-07-07 DIAGNOSIS — I10 Essential (primary) hypertension: Secondary | ICD-10-CM | POA: Diagnosis not present

## 2017-07-07 DIAGNOSIS — J441 Chronic obstructive pulmonary disease with (acute) exacerbation: Secondary | ICD-10-CM | POA: Diagnosis not present

## 2017-07-07 DIAGNOSIS — E782 Mixed hyperlipidemia: Secondary | ICD-10-CM | POA: Diagnosis not present

## 2017-07-19 ENCOUNTER — Other Ambulatory Visit: Payer: Self-pay | Admitting: Cardiovascular Disease

## 2017-07-22 DIAGNOSIS — R51 Headache: Secondary | ICD-10-CM | POA: Diagnosis not present

## 2017-07-22 DIAGNOSIS — R05 Cough: Secondary | ICD-10-CM | POA: Diagnosis not present

## 2017-07-22 DIAGNOSIS — Z5321 Procedure and treatment not carried out due to patient leaving prior to being seen by health care provider: Secondary | ICD-10-CM | POA: Diagnosis not present

## 2017-09-07 ENCOUNTER — Telehealth: Payer: Self-pay | Admitting: Cardiovascular Disease

## 2017-09-07 NOTE — Telephone Encounter (Signed)
Left message to call back  

## 2017-09-07 NOTE — Telephone Encounter (Signed)
Pt c/o medication issue:  1. Name of Medication:clopidogrel (PLAVIX) 75 MG tablet   2. How are you currently taking this medication (dosage and times per day)?Take 1 tablet (75 mg total) by mouth daily with breakfast.  3. Are you having a reaction (difficulty breathing--STAT)? no  4. What is your medication issue? When he scratch it makes his arms read he want to go back to previous medication ( please advise I did not see the name on the Medication list)

## 2017-09-08 NOTE — Telephone Encounter (Signed)
Returned call to pt he states that he would like to change his Plavix back to Brilinta. He states that he has been taking Plavix since last appt 10-04-2016.  Pt states that when he scratches his arms he develops Pt notified that we cannot change medication over the phone that he needs to be seen to change his medications. He states that he is out-of-town and will be out of town for about 5 more weeks now. He states that he does not know when he will be returning at this time he states that he will think about when he is going to return and will call back and schedule appt, he states that he will call back ASAP to plan this appt.

## 2017-09-08 NOTE — Telephone Encounter (Signed)
F/u call  Patient returning to office

## 2017-09-11 ENCOUNTER — Encounter: Payer: Medicare Other | Admitting: Cardiovascular Disease

## 2018-05-11 NOTE — Progress Notes (Signed)
Letter  

## 2018-11-01 DIAGNOSIS — Z79899 Other long term (current) drug therapy: Secondary | ICD-10-CM | POA: Diagnosis not present

## 2018-11-01 DIAGNOSIS — E1165 Type 2 diabetes mellitus with hyperglycemia: Secondary | ICD-10-CM | POA: Diagnosis not present

## 2018-11-01 DIAGNOSIS — S6991XA Unspecified injury of right wrist, hand and finger(s), initial encounter: Secondary | ICD-10-CM | POA: Diagnosis not present

## 2018-11-01 DIAGNOSIS — R0602 Shortness of breath: Secondary | ICD-10-CM | POA: Diagnosis not present

## 2018-11-01 DIAGNOSIS — L03113 Cellulitis of right upper limb: Secondary | ICD-10-CM | POA: Diagnosis not present

## 2018-11-01 DIAGNOSIS — Z720 Tobacco use: Secondary | ICD-10-CM | POA: Diagnosis not present

## 2018-11-01 DIAGNOSIS — Z23 Encounter for immunization: Secondary | ICD-10-CM | POA: Diagnosis not present

## 2018-11-01 DIAGNOSIS — D72829 Elevated white blood cell count, unspecified: Secondary | ICD-10-CM | POA: Diagnosis not present

## 2018-11-01 DIAGNOSIS — R0902 Hypoxemia: Secondary | ICD-10-CM | POA: Diagnosis not present

## 2018-11-01 DIAGNOSIS — T310 Burns involving less than 10% of body surface: Secondary | ICD-10-CM | POA: Diagnosis not present

## 2018-11-01 DIAGNOSIS — T23132A Burn of first degree of multiple left fingers (nail), not including thumb, initial encounter: Secondary | ICD-10-CM | POA: Diagnosis not present

## 2018-11-01 DIAGNOSIS — L03011 Cellulitis of right finger: Secondary | ICD-10-CM | POA: Diagnosis not present

## 2018-11-01 DIAGNOSIS — Z7902 Long term (current) use of antithrombotics/antiplatelets: Secondary | ICD-10-CM | POA: Diagnosis not present

## 2018-11-01 DIAGNOSIS — J449 Chronic obstructive pulmonary disease, unspecified: Secondary | ICD-10-CM | POA: Diagnosis not present

## 2018-11-01 DIAGNOSIS — S6992XA Unspecified injury of left wrist, hand and finger(s), initial encounter: Secondary | ICD-10-CM | POA: Diagnosis not present

## 2018-11-29 DIAGNOSIS — I255 Ischemic cardiomyopathy: Secondary | ICD-10-CM | POA: Diagnosis not present

## 2018-11-29 DIAGNOSIS — Z950 Presence of cardiac pacemaker: Secondary | ICD-10-CM | POA: Diagnosis not present

## 2018-11-29 DIAGNOSIS — I25118 Atherosclerotic heart disease of native coronary artery with other forms of angina pectoris: Secondary | ICD-10-CM | POA: Diagnosis not present

## 2018-11-29 DIAGNOSIS — R55 Syncope and collapse: Secondary | ICD-10-CM | POA: Diagnosis not present

## 2018-11-29 DIAGNOSIS — E785 Hyperlipidemia, unspecified: Secondary | ICD-10-CM | POA: Diagnosis not present

## 2018-12-07 ENCOUNTER — Telehealth: Payer: Self-pay | Admitting: Cardiovascular Disease

## 2018-12-07 NOTE — Telephone Encounter (Signed)
Trying to reach [employer].  The phone  Number we have does not belong to him.  I left a message for his brother, Molly Maduro, letting him know we are trying to reach Cid. Markevius has appointment with Dr. Salena Saner on May 27. Per Dr C the appt needs to be moved to July on a pacer day.Please reschedule!! Thanks

## 2018-12-12 ENCOUNTER — Encounter: Payer: Medicare Other | Admitting: Cardiovascular Disease

## 2018-12-12 DIAGNOSIS — I10 Essential (primary) hypertension: Secondary | ICD-10-CM | POA: Diagnosis not present

## 2018-12-12 DIAGNOSIS — E1142 Type 2 diabetes mellitus with diabetic polyneuropathy: Secondary | ICD-10-CM | POA: Diagnosis not present

## 2018-12-12 DIAGNOSIS — J441 Chronic obstructive pulmonary disease with (acute) exacerbation: Secondary | ICD-10-CM | POA: Diagnosis not present

## 2018-12-12 DIAGNOSIS — E782 Mixed hyperlipidemia: Secondary | ICD-10-CM | POA: Diagnosis not present

## 2018-12-12 DIAGNOSIS — Z0000 Encounter for general adult medical examination without abnormal findings: Secondary | ICD-10-CM | POA: Diagnosis not present

## 2018-12-12 DIAGNOSIS — Z1389 Encounter for screening for other disorder: Secondary | ICD-10-CM | POA: Diagnosis not present

## 2018-12-13 DIAGNOSIS — Z0000 Encounter for general adult medical examination without abnormal findings: Secondary | ICD-10-CM | POA: Diagnosis not present

## 2018-12-13 DIAGNOSIS — I10 Essential (primary) hypertension: Secondary | ICD-10-CM | POA: Diagnosis not present

## 2018-12-13 DIAGNOSIS — R2689 Other abnormalities of gait and mobility: Secondary | ICD-10-CM | POA: Diagnosis not present

## 2018-12-13 DIAGNOSIS — E1142 Type 2 diabetes mellitus with diabetic polyneuropathy: Secondary | ICD-10-CM | POA: Diagnosis not present

## 2018-12-13 DIAGNOSIS — E782 Mixed hyperlipidemia: Secondary | ICD-10-CM | POA: Diagnosis not present

## 2018-12-19 DIAGNOSIS — I509 Heart failure, unspecified: Secondary | ICD-10-CM | POA: Diagnosis not present

## 2018-12-19 DIAGNOSIS — Z79899 Other long term (current) drug therapy: Secondary | ICD-10-CM | POA: Diagnosis not present

## 2018-12-19 DIAGNOSIS — E876 Hypokalemia: Secondary | ICD-10-CM | POA: Diagnosis not present

## 2018-12-19 DIAGNOSIS — E119 Type 2 diabetes mellitus without complications: Secondary | ICD-10-CM | POA: Diagnosis not present

## 2018-12-19 DIAGNOSIS — K219 Gastro-esophageal reflux disease without esophagitis: Secondary | ICD-10-CM | POA: Diagnosis not present

## 2018-12-19 DIAGNOSIS — J449 Chronic obstructive pulmonary disease, unspecified: Secondary | ICD-10-CM | POA: Diagnosis not present

## 2018-12-19 DIAGNOSIS — E1165 Type 2 diabetes mellitus with hyperglycemia: Secondary | ICD-10-CM | POA: Diagnosis not present

## 2018-12-19 DIAGNOSIS — L03115 Cellulitis of right lower limb: Secondary | ICD-10-CM | POA: Diagnosis not present

## 2018-12-19 DIAGNOSIS — I252 Old myocardial infarction: Secondary | ICD-10-CM | POA: Diagnosis not present

## 2018-12-19 DIAGNOSIS — Z794 Long term (current) use of insulin: Secondary | ICD-10-CM | POA: Diagnosis not present

## 2018-12-19 DIAGNOSIS — I96 Gangrene, not elsewhere classified: Secondary | ICD-10-CM | POA: Diagnosis not present

## 2018-12-19 DIAGNOSIS — Z91013 Allergy to seafood: Secondary | ICD-10-CM | POA: Diagnosis not present

## 2018-12-19 DIAGNOSIS — Z7902 Long term (current) use of antithrombotics/antiplatelets: Secondary | ICD-10-CM | POA: Diagnosis not present

## 2018-12-19 DIAGNOSIS — L02414 Cutaneous abscess of left upper limb: Secondary | ICD-10-CM | POA: Diagnosis not present

## 2018-12-19 DIAGNOSIS — Z955 Presence of coronary angioplasty implant and graft: Secondary | ICD-10-CM | POA: Diagnosis not present

## 2018-12-19 DIAGNOSIS — I2510 Atherosclerotic heart disease of native coronary artery without angina pectoris: Secondary | ICD-10-CM | POA: Diagnosis not present

## 2018-12-19 DIAGNOSIS — G4730 Sleep apnea, unspecified: Secondary | ICD-10-CM | POA: Diagnosis not present

## 2018-12-19 DIAGNOSIS — I110 Hypertensive heart disease with heart failure: Secondary | ICD-10-CM | POA: Diagnosis not present

## 2018-12-19 DIAGNOSIS — E871 Hypo-osmolality and hyponatremia: Secondary | ICD-10-CM | POA: Diagnosis not present

## 2018-12-19 DIAGNOSIS — L03113 Cellulitis of right upper limb: Secondary | ICD-10-CM | POA: Diagnosis not present

## 2018-12-19 DIAGNOSIS — E1152 Type 2 diabetes mellitus with diabetic peripheral angiopathy with gangrene: Secondary | ICD-10-CM | POA: Diagnosis not present

## 2018-12-20 DIAGNOSIS — L03115 Cellulitis of right lower limb: Secondary | ICD-10-CM | POA: Diagnosis not present

## 2018-12-20 DIAGNOSIS — L03113 Cellulitis of right upper limb: Secondary | ICD-10-CM | POA: Diagnosis not present

## 2018-12-20 DIAGNOSIS — E871 Hypo-osmolality and hyponatremia: Secondary | ICD-10-CM | POA: Diagnosis not present

## 2018-12-20 DIAGNOSIS — E1152 Type 2 diabetes mellitus with diabetic peripheral angiopathy with gangrene: Secondary | ICD-10-CM | POA: Diagnosis not present

## 2018-12-20 DIAGNOSIS — I96 Gangrene, not elsewhere classified: Secondary | ICD-10-CM | POA: Diagnosis not present

## 2018-12-21 DIAGNOSIS — I96 Gangrene, not elsewhere classified: Secondary | ICD-10-CM | POA: Diagnosis not present

## 2018-12-21 DIAGNOSIS — L03113 Cellulitis of right upper limb: Secondary | ICD-10-CM | POA: Diagnosis not present

## 2018-12-21 DIAGNOSIS — E871 Hypo-osmolality and hyponatremia: Secondary | ICD-10-CM | POA: Diagnosis not present

## 2018-12-21 DIAGNOSIS — L03115 Cellulitis of right lower limb: Secondary | ICD-10-CM | POA: Diagnosis not present

## 2018-12-21 DIAGNOSIS — E1152 Type 2 diabetes mellitus with diabetic peripheral angiopathy with gangrene: Secondary | ICD-10-CM | POA: Diagnosis not present

## 2019-01-02 DIAGNOSIS — I10 Essential (primary) hypertension: Secondary | ICD-10-CM | POA: Diagnosis not present

## 2019-01-02 DIAGNOSIS — L0390 Cellulitis, unspecified: Secondary | ICD-10-CM | POA: Diagnosis not present

## 2019-01-02 DIAGNOSIS — E1142 Type 2 diabetes mellitus with diabetic polyneuropathy: Secondary | ICD-10-CM | POA: Diagnosis not present

## 2019-01-30 DIAGNOSIS — R55 Syncope and collapse: Secondary | ICD-10-CM | POA: Diagnosis not present

## 2019-01-30 DIAGNOSIS — Z0000 Encounter for general adult medical examination without abnormal findings: Secondary | ICD-10-CM | POA: Diagnosis not present

## 2019-01-30 DIAGNOSIS — R5383 Other fatigue: Secondary | ICD-10-CM | POA: Diagnosis not present

## 2019-02-01 ENCOUNTER — Telehealth: Payer: Self-pay | Admitting: *Deleted

## 2019-02-01 NOTE — Telephone Encounter (Signed)
Left a message for the patient to call back concerning his appointment on Monday 7/20 and a recent download

## 2019-02-04 ENCOUNTER — Encounter: Payer: Medicare Other | Admitting: Cardiovascular Disease

## 2019-02-07 ENCOUNTER — Telehealth: Payer: Self-pay | Admitting: *Deleted

## 2019-02-07 NOTE — Telephone Encounter (Signed)
Attempted to reach patient to re-establish device follow-up. Call was initially answered, but seemed to have poor connection. Called back and LMOVM requesting call to DC. Gave direct number.  New Carelink monitor shipped 12/11/18, will attempt to assist pt with sending transmission.

## 2019-02-07 NOTE — Telephone Encounter (Signed)
-----   Message from Sanda Klein, MD sent at 02/04/2019 12:59 PM EDT ----- He was No Show for device check today

## 2019-02-08 NOTE — Telephone Encounter (Signed)
LMOVM for pt to return call 

## 2019-02-12 NOTE — Telephone Encounter (Signed)
LMOVM for pt to call my direct office number. °

## 2019-02-14 NOTE — Telephone Encounter (Signed)
Kem, please mail certified letter to patient as we have been unable to reach him by phone.  Chanetta Marshall, NP 02/14/2019 8:16 AM

## 2019-02-14 NOTE — Telephone Encounter (Signed)
Certified Letter sent 02-14-2019

## 2019-03-04 DIAGNOSIS — M810 Age-related osteoporosis without current pathological fracture: Secondary | ICD-10-CM | POA: Diagnosis not present

## 2019-03-18 DIAGNOSIS — H8113 Benign paroxysmal vertigo, bilateral: Secondary | ICD-10-CM | POA: Diagnosis not present

## 2019-06-18 ENCOUNTER — Encounter: Payer: Medicare Other | Admitting: Cardiovascular Disease

## 2019-07-29 ENCOUNTER — Other Ambulatory Visit: Payer: Self-pay

## 2019-07-29 ENCOUNTER — Ambulatory Visit (INDEPENDENT_AMBULATORY_CARE_PROVIDER_SITE_OTHER): Payer: Medicare Other | Admitting: Cardiovascular Disease

## 2019-07-29 VITALS — BP 120/64 | HR 80 | Temp 97.4°F | Ht 70.0 in | Wt 217.0 lb

## 2019-07-29 DIAGNOSIS — I2102 ST elevation (STEMI) myocardial infarction involving left anterior descending coronary artery: Secondary | ICD-10-CM

## 2019-07-29 DIAGNOSIS — R55 Syncope and collapse: Secondary | ICD-10-CM | POA: Diagnosis not present

## 2019-07-29 DIAGNOSIS — Z9861 Coronary angioplasty status: Secondary | ICD-10-CM

## 2019-07-29 DIAGNOSIS — Z720 Tobacco use: Secondary | ICD-10-CM

## 2019-07-29 DIAGNOSIS — Z794 Long term (current) use of insulin: Secondary | ICD-10-CM

## 2019-07-29 DIAGNOSIS — I25118 Atherosclerotic heart disease of native coronary artery with other forms of angina pectoris: Secondary | ICD-10-CM

## 2019-07-29 DIAGNOSIS — I480 Paroxysmal atrial fibrillation: Secondary | ICD-10-CM

## 2019-07-29 DIAGNOSIS — I2119 ST elevation (STEMI) myocardial infarction involving other coronary artery of inferior wall: Secondary | ICD-10-CM

## 2019-07-29 DIAGNOSIS — Z950 Presence of cardiac pacemaker: Secondary | ICD-10-CM

## 2019-07-29 DIAGNOSIS — I2510 Atherosclerotic heart disease of native coronary artery without angina pectoris: Secondary | ICD-10-CM

## 2019-07-29 DIAGNOSIS — I255 Ischemic cardiomyopathy: Secondary | ICD-10-CM

## 2019-07-29 DIAGNOSIS — E782 Mixed hyperlipidemia: Secondary | ICD-10-CM | POA: Diagnosis not present

## 2019-07-29 DIAGNOSIS — I200 Unstable angina: Secondary | ICD-10-CM

## 2019-07-29 DIAGNOSIS — E1159 Type 2 diabetes mellitus with other circulatory complications: Secondary | ICD-10-CM

## 2019-07-29 DIAGNOSIS — R9431 Abnormal electrocardiogram [ECG] [EKG]: Secondary | ICD-10-CM

## 2019-07-29 MED ORDER — ATORVASTATIN CALCIUM 80 MG PO TABS: ORAL_TABLET | ORAL | 1 refills | Status: DC

## 2019-07-29 MED ORDER — METOPROLOL TARTRATE 50 MG PO TABS: 50.0000 mg | ORAL_TABLET | Freq: Two times a day (BID) | ORAL | 1 refills | Status: DC

## 2019-07-29 MED ORDER — CLOPIDOGREL BISULFATE 75 MG PO TABS: 75.0000 mg | ORAL_TABLET | Freq: Every day | ORAL | 3 refills | Status: DC

## 2019-07-29 MED ORDER — NITROGLYCERIN 0.4 MG SL SUBL: 0.4000 mg | SUBLINGUAL_TABLET | SUBLINGUAL | 2 refills | Status: DC | PRN

## 2019-07-29 NOTE — Progress Notes (Signed)
Patient ID: Frank Powell, male   DOB: 28-Feb-1961, 59 y.o.   MRN: [id number]     Cardiology Office Note    Date:  07/31/2019   ID:  AARJAV SCHECHTER, DOB [birth year], MRN [id number]  PCP:  Toma Deiters, MD  Cardiologist:   Thurmon Fair, MD   Chief Complaint  Patient presents with  . Chest Pain    History of Present Illness:  Frank Powell is a 59 y.o. male with a history of premature onset coronary artery disease, s/p 2016 anterior STEMI (mid LAD DES 3.538 mm Xience Alpine ) and 2017 acute inferior wall STEMI PCI (mid RCA DES Synergy3 28 mm), also with 80% lesions at the ostial ramus intermedius, small OM1, small second diagonal and ostial PDA; neurocardiogenic syncope s/p dual-chamber permanent pacemaker (2014, Medtronic), poorly controlled type 2 diabetes mellitus, ongoing tobacco abuse and a history of noncompliance, possibly related to bipolar disorder.  By his most recent echocardiogram he did have wall motion abnormalities in the apex and basal inferior wall but did have overall preserved LV systolic function with EF 55 to 60%.  Frank Powell gives a pretty typical description of exertional chest discomfort, CCS functional class II.  It feels like a "cinderblock" lying on his chest.  It only happens if he works hard in the yard or helping as brother work on cars for about 20 minutes.  The symptoms go away if he goes and lies down.  He has not taken any nitroglycerin.  He reports that he is still taking both aspirin and clopidogrel as well as his cholesterol medicine.  He has been taking metoprolol, but only once a day.  He is no longer taking the ranitidine after the medication was pulled off the shelves.  He has been living in [place] for several months and has missed both clinical follow-ups and pacemaker checks.  He continues to smoke 3/4 to 1 pack of cigarettes a day.  Diabetes control is poor.  He was prescribed Invokana but had to stop it due to nausea and diarrhea.  He has not had  follow-up injuries or bleeding problems.  He denies syncope and palpitations.  He is no longer on benazepril.  Pacemaker interrogation shows virtually no pacing (99.3% atrial sensed, ventricular sensed rhythm), but does have frequent episodes of pacing for "bradycardia" which could represent aborted episodes of neurocardiogenic syncope.  Battery longevity is estimated at 9.5 years.  Lead parameters are excellent.  He has not had any episodes of atrial mode switch or ventricular tachycardia.  His electrocardiogram continues to show Q waves in leads V1-V3 and appears to show new Q waves in leads II, 3, aVF.  He has left atrial enlargement.  QTc remains prolonged at about 500 ms (he takes Seroquel and trazodone and Geodon).  He had a 12-minute episode of atrial fibrillation that occurred in 2017 at the time of his heart attack.  There has been no recurrence of atrial fibrillation since that time.  He does not have a history of stroke/TIA.  Past Medical History:  Diagnosis Date  . Atrial fib/flutter, transient   . Bipolar 1 disorder (HCC)   . COPD (chronic obstructive pulmonary disease) (HCC)   . Coronary artery disease   . DM2 (diabetes mellitus, type 2) (HCC) 06/02/2013  . GERD (gastroesophageal reflux disease)   . Hypertriglyceridemia 06/02/2013  . Multiple personality (HCC)   . Myocardial infarct (HCC) 03/21/2015   DES LAD  . Neurocardiogenic syncope 06/02/2013  . Obesity (BMI  30.0-34.9) 06/02/2013  . Pacemaker 06/02/2013    Past Surgical History:  Procedure Laterality Date  . CARDIAC CATHETERIZATION  09/26/2002   normal coronary arteries and LV  . CARDIAC CATHETERIZATION N/A 03/21/2015   Procedure: Left Heart Cath and Coronary Angiography;  Surgeon: Troy Sine, MD; mLAD 100%, dLAD 80%, D2 80%, EF 50-55%  . CARDIAC CATHETERIZATION N/A 03/21/2015   Procedure: Coronary Stent Intervention;  Surgeon: Troy Sine, MD; 3.538 mm Xience Alpine DES to the LAD   . CARDIAC CATHETERIZATION  N/A 05/25/2016   Procedure: Left Heart Cath and Coronary Angiography;  Surgeon: Jettie Booze, MD;  Location: [facility];  Service: Cardiovascular;  Laterality: N/A;  . CARDIAC CATHETERIZATION N/A 05/25/2016   Procedure: Coronary Stent Intervention;  Surgeon: Jettie Booze, MD;  Location: [facility];  Service: Cardiovascular;  Laterality: N/A;  . NM MYOCAR PERF WALL MOTION  10/06/2011   No ischemia  . PACEMAKER INSERTION Left 02/03/2003   Medtronic  . PERMANENT PACEMAKER GENERATOR CHANGE N/A 09/28/2012   Procedure: PERMANENT PACEMAKER GENERATOR CHANGE;  Surgeon: Sanda Klein, MD; Medtronic     Outpatient Medications Prior to Visit  Medication Sig Dispense Refill  . acetaminophen (TYLENOL) 325 MG tablet Take 2 tablets (650 mg total) by mouth every 4 (four) hours as needed for headache or mild pain.    Marland Kitchen albuterol (PROVENTIL HFA;VENTOLIN HFA) 108 (90 BASE) MCG/ACT inhaler Inhale 1-2 puffs into the lungs every 6 (six) hours as needed for wheezing or shortness of breath.    . ALPRAZolam (XANAX) 1 MG tablet Take 1 mg by mouth See admin instructions. Take 1 tablet 4 times daily and 2 tablets at bedtime as needed    . aspirin EC 81 MG EC tablet Take 1 tablet (81 mg total) by mouth daily.    . DULoxetine (CYMBALTA) 30 MG capsule Take 30 mg by mouth daily.    Marland Kitchen gabapentin (NEURONTIN) 300 MG capsule Take 300-600 mg by mouth See admin instructions. Takes 1 capsule in am and at lunch and 2 capsules at night    . HUMALOG KWIKPEN 100 UNIT/ML KiwkPen Inject 50 Units into the skin 3 (three) times daily.     . Insulin Degludec (TRESIBA FLEXTOUCH [place]) Inject 80 Units into the skin daily.     . QUEtiapine (SEROQUEL XR) 300 MG 24 hr tablet Take 600 mg by mouth at bedtime.    Marland Kitchen tiotropium (SPIRIVA) 18 MCG inhalation capsule Place 18 mcg into inhaler and inhale daily.    . traZODone (DESYREL) 150 MG tablet Take 150-300 mg by mouth at bedtime as needed for sleep.     . ziprasidone (GEODON)  60 MG capsule Take 60 mg by mouth 2 (two) times daily with a meal.     . atorvastatin (LIPITOR) 80 MG tablet TAKE 1 TABLET DAILY AT 6 PM. 30 tablet 0  . benazepril (LOTENSIN) 5 MG tablet Take 5 mg by mouth daily.    . clopidogrel (PLAVIX) 75 MG tablet Take 1 tablet (75 mg total) by mouth daily with breakfast. 90 tablet 3  . metoprolol tartrate (LOPRESSOR) 25 MG tablet Take 1 tablet (25 mg total) by mouth 2 (two) times daily. 180 tablet 3  . nitroGLYCERIN (NITROSTAT) 0.4 MG SL tablet Place 1 tablet (0.4 mg total) under the tongue every 5 (five) minutes x 3 doses as needed for chest pain. 25 tablet 12  . ranitidine (ZANTAC) 150 MG tablet Take 1 tablet by mouth 2 (two) times  daily as needed.     No facility-administered medications prior to visit.     Allergies:   Shellfish allergy   Social History   Socioeconomic History  . Marital status: Divorced    Spouse name: Not on file  . Number of children: Not on file  . Years of education: Not on file  . Highest education level: Not on file  Occupational History  . Occupation: Disabled  Tobacco Use  . Smoking status: Current Every Day Smoker    Packs/day: 1.00    Types: Cigarettes  . Smokeless tobacco: Never Used  Substance and Sexual Activity  . Alcohol use: No  . Drug use: No  . Sexual activity: Not on file  Other Topics Concern  . Not on file  Social History Narrative   Lives in [place], [place].   Social Determinants of Health   Financial Resource Strain:   . Difficulty of Paying Living Expenses: Not on file  Food Insecurity:   . Worried About Programme researcher, broadcasting/film/video in the Last Year: Not on file  . Ran Out of Food in the Last Year: Not on file  Transportation Needs:   . Lack of Transportation (Medical): Not on file  . Lack of Transportation (Non-Medical): Not on file  Physical Activity:   . Days of Exercise per Week: Not on file  . Minutes of Exercise per Session: Not on file  Stress:   . Feeling of Stress : Not on file  Social  Connections:   . Frequency of Communication with Friends and Family: Not on file  . Frequency of Social Gatherings with Friends and Family: Not on file  . Attends Religious Services: Not on file  . Active Member of Clubs or Organizations: Not on file  . Attends Banker Meetings: Not on file  . Marital Status: Not on file     Family History:  The patient's family history includes Coronary artery disease in his father; Diabetes in his father and mother.   ROS:   Please see the history of present illness.    ROS All other systems are reviewed and are negative.   PHYSICAL EXAM:   VS:  BP 120/64   Pulse 80   Temp (!) 97.4 F (36.3 C)   Ht 5\' 10"  (1.778 m)   Wt 217 lb (98.4 kg)   SpO2 97%   BMI 31.14 kg/m     General: Alert, oriented x3, no distress, appears a little disheveled, but in a good mood Head: no evidence of trauma, PERRL, EOMI, no exophtalmos or lid lag, no myxedema, no xanthelasma; normal ears, nose and oropharynx Neck: normal jugular venous pulsations and no hepatojugular reflux; brisk carotid pulses without delay and no carotid bruits Chest: clear to auscultation, no signs of consolidation by percussion or palpation, normal fremitus, symmetrical and full respiratory excursions Cardiovascular: normal position and quality of the apical impulse, regular rhythm, normal first and second heart sounds, no murmurs, rubs or gallops Abdomen: no tenderness or distention, no masses by palpation, no abnormal pulsatility or arterial bruits, normal bowel sounds, no hepatosplenomegaly Extremities: no clubbing, cyanosis or edema; 2+ radial, ulnar and brachial pulses bilaterally; 2+ right femoral, posterior tibial and dorsalis pedis pulses; 2+ left femoral, posterior tibial and dorsalis pedis pulses; no subclavian or femoral bruits Neurological: grossly nonfocal Psych: Normal mood and affect   Wt Readings from Last 3 Encounters:  07/29/19 217 lb (98.4 kg)  10/04/16 219 lb  (99.3 kg)  05/25/16 209  lb 4.8 oz (94.9 kg)      Studies/Labs Reviewed:    EKG:  EKG is ordered today.  It shows sinus rhythm, left atrial abnormality, old Q waves in leads V1-V3 but also new Q waves in leads II, 3, aVF, QTC 500 ms  ECHO 05/26/2016: - Left ventricle: The cavity size was normal. Systolic function was   normal. The estimated ejection fraction was in the range of 55%   to 60%. There is hypokinesis of the distal septal, inferior   apical and apical myocardium. There is akinesis of the   basalinferior myocardium. - Tricuspid valve: There was trivial regurgitation. - Pulmonary arteries: Systolic pressure could not be accurately   estimated.  CARDIAC CATH 2017  LV end diastolic pressure is mildly elevated. LVEDP 18 mm Hg.  Ramus lesion, 80 %stenosed.  Ost 1st Mrg to 1st Mrg lesion, 75 %stenosed.  Ost 2nd Diag lesion, 80 %stenosed. Small vessel.  Mid RCA lesion, 99 %stenosed. A STENT SYNERGY DES 3X28 drug eluting stent was successfully placed and postdilated to 3.5 mm.  Post intervention, there is a 0% residual stenosis.  Ost RPDA lesion, 80 %stenosed.   Continue dual antiplatelet therapy for at least a year.  Consider PCI of the ramus in the future.  He will need aggressive secondary prevention.  Clopidogrel was used at [facility].  Check with pharmacy about compatability with Brilinta and his psych meds.  Consider switching to Brilinta or Effient.  Since compliance may be an issue for him, daily Effient may be reasonable as well.   Recent Labs: No results found for requested labs within last 8760 hours.   Lipid Panel    Component Value Date/Time   CHOL 139 05/26/2016 0245   TRIG 133 05/26/2016 0245   HDL 34 (L) 05/26/2016 0245   CHOLHDL 4.1 05/26/2016 0245   VLDL 27 05/26/2016 0245   LDLCALC 78 05/26/2016 0245   12/13/2018 Total cholesterol 142, HDL 30, LDL 62, triglycerides 251 Creatinine 0.81 Hemoglobin A1c 13% TSH 1.990  ASSESSMENT:    1. Coronary  artery disease of native artery of native heart with stable angina pectoris (HCC)   2. Paroxysmal atrial fibrillation (HCC)   3. Neurocardiogenic syncope   4. Pacemaker - Medtronic adaptadual-chamber implanted 2004, generator change 2014   5. Mixed hyperlipidemia   6. Tobacco abuse   7. Type 2 diabetes mellitus with other circulatory complication, with long-term current use of insulin (HCC)   8. QT prolongation      PLAN:  In order of problems listed above:   1. CAD: He describes typical exertional angina pectoris CCS functional class II.  We will increase his beta-blocker to twice daily.  Continue aspirin, clopidogrel, statin.  Option for PCI to the ramus intermedius was described on his last procedure in 2017.  We will go that route if we cannot control his angina with medical therapy.  Spent a long time discussing the difference between stable and unstable angina and telling him when he should seek emergency medical attention.  Refilled his nitroglycerin prescription.  There is a very high likelihood of disease progression and future need for revascularization, since he continues to smoke and his diabetes is very poorly controlled.    2. AFib: He had a single 12 minute episode of paroxysmal atrial fibrillation that has not recurred in the last 3 years.  Since he is taking dual antiplatelet therapy, anticoagulation would have increased risk.  I would not recommend anticoagulation unless there is pacemaker evidence  of increased burden of arrhythmia.. 3. Neurally mediated syncope: Essentially resolved following pacemaker implantation.  Avoid diuretics and potent vasodilators if possible. 4. PPM: Normal device function.  Need to reintegrate him through our device clinic to do remote downloads every 3 months. 5. HLP: Excellent LDL, but HDL is low and is unlikely to improve without substantial improvement in glycemic control and diet. 6. Tobacco abuse: He does not appear prepared to commit to a  program of smoking cessation. 7. DM, poorly controlled: Continues to drink sodas and generally not appear to appropriate diet. 8. Long QT interval due to medications: Borderline acceptable QT. 9. Bipolar disorder: Note that he is taking several medications that could prolong the QT interval. Cautious monitoring and avoid any other drugs that can cause similar QT prolongation.  There has been no evidence of ventricular arrhythmia on his pacemaker.    Medication Adjustments/Labs and Tests Ordered: Current medicines are reviewed at length with the patient today.  Concerns regarding medicines are outlined above.  Medication changes, Labs and Tests ordered today are listed in the Patient Instructions below. Patient Instructions  Medication Instructions:  STOP the Benazepril INCREASE the Metoprolol to 50 mg twice daily  *If you need a refill on your cardiac medications before your next appointment, please call your pharmacy*  Lab Work: None ordered If you have labs (blood work) drawn today and your tests are completely normal, you will receive your results only by: Marland Kitchen MyChart Message (if you have MyChart) OR . A paper copy in the mail If you have any lab test that is abnormal or we need to change your treatment, we will call you to review the results.  Testing/Procedures: None ordered  Follow-Up: At [facility], you and your health needs are our priority.  As part of our continuing mission to provide you with exceptional heart care, we have created designated Provider Care Teams.  These Care Teams include your primary Cardiologist (physician) and Advanced Practice Providers (APPs -  Physician Assistants and Nurse Practitioners) who all work together to provide you with the care you need, when you need it.  Your next appointment:   3 month(s)  The format for your next appointment:   In Person  Provider:   Thurmon Fair, MD        Signed, Thurmon Fair, MD  07/31/2019 11:55 AM     [facility] [address] [place], [place], [place]  [address] Phone: [phone]; Fax: [phone]

## 2019-07-29 NOTE — Patient Instructions (Signed)
Medication Instructions:  STOP the Benazepril INCREASE the Metoprolol to 50 mg twice daily  *If you need a refill on your cardiac medications before your next appointment, please call your pharmacy*  Lab Work: None ordered If you have labs (blood work) drawn today and your tests are completely normal, you will receive your results only by: Marland Kitchen MyChart Message (if you have MyChart) OR . A paper copy in the mail If you have any lab test that is abnormal or we need to change your treatment, we will call you to review the results.  Testing/Procedures: None ordered  Follow-Up: At [facility], you and your health needs are our priority.  As part of our continuing mission to provide you with exceptional heart care, we have created designated Provider Care Teams.  These Care Teams include your primary Cardiologist (physician) and Advanced Practice Providers (APPs -  Physician Assistants and Nurse Practitioners) who all work together to provide you with the care you need, when you need it.  Your next appointment:   3 month(s)  The format for your next appointment:   In Person  Provider:   Thurmon Fair, MD

## 2019-07-31 ENCOUNTER — Encounter: Payer: Self-pay | Admitting: Cardiovascular Disease

## 2019-07-31 ENCOUNTER — Other Ambulatory Visit (INDEPENDENT_AMBULATORY_CARE_PROVIDER_SITE_OTHER): Payer: Medicare Other

## 2019-07-31 DIAGNOSIS — I200 Unstable angina: Secondary | ICD-10-CM

## 2019-07-31 DIAGNOSIS — I2102 ST elevation (STEMI) myocardial infarction involving left anterior descending coronary artery: Secondary | ICD-10-CM

## 2019-07-31 DIAGNOSIS — E1142 Type 2 diabetes mellitus with diabetic polyneuropathy: Secondary | ICD-10-CM | POA: Diagnosis not present

## 2019-07-31 DIAGNOSIS — H8113 Benign paroxysmal vertigo, bilateral: Secondary | ICD-10-CM | POA: Diagnosis not present

## 2019-07-31 DIAGNOSIS — I2510 Atherosclerotic heart disease of native coronary artery without angina pectoris: Secondary | ICD-10-CM | POA: Diagnosis not present

## 2019-07-31 DIAGNOSIS — E785 Hyperlipidemia, unspecified: Secondary | ICD-10-CM | POA: Diagnosis not present

## 2019-07-31 DIAGNOSIS — I2119 ST elevation (STEMI) myocardial infarction involving other coronary artery of inferior wall: Secondary | ICD-10-CM

## 2019-07-31 DIAGNOSIS — Z0000 Encounter for general adult medical examination without abnormal findings: Secondary | ICD-10-CM | POA: Diagnosis not present

## 2019-07-31 DIAGNOSIS — I10 Essential (primary) hypertension: Secondary | ICD-10-CM | POA: Diagnosis not present

## 2019-07-31 DIAGNOSIS — I255 Ischemic cardiomyopathy: Secondary | ICD-10-CM

## 2019-07-31 DIAGNOSIS — I25118 Atherosclerotic heart disease of native coronary artery with other forms of angina pectoris: Secondary | ICD-10-CM

## 2019-07-31 DIAGNOSIS — I480 Paroxysmal atrial fibrillation: Secondary | ICD-10-CM

## 2019-08-02 ENCOUNTER — Telehealth: Payer: Self-pay

## 2019-08-02 NOTE — Telephone Encounter (Signed)
I called the pt but received the message "due to network difficulties my call can not go thru."

## 2019-08-02 NOTE — Telephone Encounter (Signed)
-----   Message from Thurmon Fair, MD sent at 07/31/2019 11:55 AM EST ----- Can we please try again to get him in device clinic? He had moved to [place], but is back in town

## 2019-08-06 DIAGNOSIS — E1165 Type 2 diabetes mellitus with hyperglycemia: Secondary | ICD-10-CM | POA: Diagnosis not present

## 2019-08-09 DIAGNOSIS — E559 Vitamin D deficiency, unspecified: Secondary | ICD-10-CM | POA: Diagnosis not present

## 2019-08-09 DIAGNOSIS — E1142 Type 2 diabetes mellitus with diabetic polyneuropathy: Secondary | ICD-10-CM | POA: Diagnosis not present

## 2019-08-09 DIAGNOSIS — I2510 Atherosclerotic heart disease of native coronary artery without angina pectoris: Secondary | ICD-10-CM | POA: Diagnosis not present

## 2019-08-09 DIAGNOSIS — H8113 Benign paroxysmal vertigo, bilateral: Secondary | ICD-10-CM | POA: Diagnosis not present

## 2019-08-09 DIAGNOSIS — I10 Essential (primary) hypertension: Secondary | ICD-10-CM | POA: Diagnosis not present

## 2019-08-09 DIAGNOSIS — E785 Hyperlipidemia, unspecified: Secondary | ICD-10-CM | POA: Diagnosis not present

## 2019-08-14 NOTE — Telephone Encounter (Signed)
LMOVM for pt to call my direct office number to schedule an appointment with the device clinic and/or a home remote check with his home monitor.

## 2019-09-04 NOTE — Telephone Encounter (Signed)
Letter sent.

## 2019-09-11 ENCOUNTER — Other Ambulatory Visit: Payer: Self-pay | Admitting: Cardiovascular Disease

## 2019-09-26 NOTE — Telephone Encounter (Signed)
Certified Letter sent 09-26-2019 

## 2019-10-29 DIAGNOSIS — I2510 Atherosclerotic heart disease of native coronary artery without angina pectoris: Secondary | ICD-10-CM | POA: Diagnosis not present

## 2019-10-29 DIAGNOSIS — E1142 Type 2 diabetes mellitus with diabetic polyneuropathy: Secondary | ICD-10-CM | POA: Diagnosis not present

## 2019-10-29 DIAGNOSIS — I10 Essential (primary) hypertension: Secondary | ICD-10-CM | POA: Diagnosis not present

## 2019-10-29 DIAGNOSIS — E785 Hyperlipidemia, unspecified: Secondary | ICD-10-CM | POA: Diagnosis not present

## 2019-10-29 DIAGNOSIS — Z0000 Encounter for general adult medical examination without abnormal findings: Secondary | ICD-10-CM | POA: Diagnosis not present

## 2019-10-29 DIAGNOSIS — H8113 Benign paroxysmal vertigo, bilateral: Secondary | ICD-10-CM | POA: Diagnosis not present

## 2019-10-30 ENCOUNTER — Ambulatory Visit: Payer: Medicare Other | Admitting: Cardiovascular Disease

## 2019-11-11 DIAGNOSIS — M7022 Olecranon bursitis, left elbow: Secondary | ICD-10-CM | POA: Diagnosis not present

## 2019-12-20 ENCOUNTER — Ambulatory Visit: Payer: Medicare Other | Admitting: Medical

## 2020-01-06 DIAGNOSIS — E1143 Type 2 diabetes mellitus with diabetic autonomic (poly)neuropathy: Secondary | ICD-10-CM | POA: Diagnosis not present

## 2020-01-06 DIAGNOSIS — E785 Hyperlipidemia, unspecified: Secondary | ICD-10-CM | POA: Diagnosis not present

## 2020-01-06 DIAGNOSIS — G458 Other transient cerebral ischemic attacks and related syndromes: Secondary | ICD-10-CM | POA: Diagnosis not present

## 2020-01-13 ENCOUNTER — Encounter: Payer: Medicare Other | Admitting: Cardiovascular Disease

## 2020-01-14 ENCOUNTER — Other Ambulatory Visit: Payer: Self-pay | Admitting: Cardiovascular Disease

## 2020-01-16 ENCOUNTER — Other Ambulatory Visit: Payer: Self-pay | Admitting: Cardiovascular Disease

## 2020-01-28 DIAGNOSIS — I708 Atherosclerosis of other arteries: Secondary | ICD-10-CM | POA: Diagnosis not present

## 2020-01-28 DIAGNOSIS — I6523 Occlusion and stenosis of bilateral carotid arteries: Secondary | ICD-10-CM | POA: Diagnosis not present

## 2020-01-28 DIAGNOSIS — G4489 Other headache syndrome: Secondary | ICD-10-CM | POA: Diagnosis not present

## 2020-01-28 DIAGNOSIS — G319 Degenerative disease of nervous system, unspecified: Secondary | ICD-10-CM | POA: Diagnosis not present

## 2020-01-28 DIAGNOSIS — J3489 Other specified disorders of nose and nasal sinuses: Secondary | ICD-10-CM | POA: Diagnosis not present

## 2020-01-28 DIAGNOSIS — G458 Other transient cerebral ischemic attacks and related syndromes: Secondary | ICD-10-CM | POA: Diagnosis not present

## 2020-01-28 DIAGNOSIS — I6381 Other cerebral infarction due to occlusion or stenosis of small artery: Secondary | ICD-10-CM | POA: Diagnosis not present

## 2020-01-30 ENCOUNTER — Ambulatory Visit: Payer: Medicare Other | Admitting: Cardiovascular Disease

## 2020-02-04 DIAGNOSIS — E1136 Type 2 diabetes mellitus with diabetic cataract: Secondary | ICD-10-CM | POA: Diagnosis not present

## 2020-02-04 DIAGNOSIS — Z794 Long term (current) use of insulin: Secondary | ICD-10-CM | POA: Diagnosis not present

## 2020-02-04 DIAGNOSIS — H52203 Unspecified astigmatism, bilateral: Secondary | ICD-10-CM | POA: Diagnosis not present

## 2020-02-04 DIAGNOSIS — Z8673 Personal history of transient ischemic attack (TIA), and cerebral infarction without residual deficits: Secondary | ICD-10-CM | POA: Diagnosis not present

## 2020-02-04 DIAGNOSIS — H25043 Posterior subcapsular polar age-related cataract, bilateral: Secondary | ICD-10-CM | POA: Diagnosis not present

## 2020-02-04 DIAGNOSIS — H2513 Age-related nuclear cataract, bilateral: Secondary | ICD-10-CM | POA: Diagnosis not present

## 2020-02-04 DIAGNOSIS — Z8679 Personal history of other diseases of the circulatory system: Secondary | ICD-10-CM | POA: Diagnosis not present

## 2020-03-02 ENCOUNTER — Other Ambulatory Visit: Payer: Self-pay

## 2020-03-09 ENCOUNTER — Encounter: Payer: Medicare Other | Admitting: Cardiovascular Disease

## 2020-03-11 DIAGNOSIS — M71322 Other bursal cyst, left elbow: Secondary | ICD-10-CM | POA: Diagnosis not present

## 2020-03-16 ENCOUNTER — Encounter: Payer: Medicare Other | Admitting: Vascular Surgery

## 2020-04-07 DIAGNOSIS — E1143 Type 2 diabetes mellitus with diabetic autonomic (poly)neuropathy: Secondary | ICD-10-CM | POA: Diagnosis not present

## 2020-04-07 DIAGNOSIS — M71322 Other bursal cyst, left elbow: Secondary | ICD-10-CM | POA: Diagnosis not present

## 2020-04-07 DIAGNOSIS — G458 Other transient cerebral ischemic attacks and related syndromes: Secondary | ICD-10-CM | POA: Diagnosis not present

## 2020-04-07 DIAGNOSIS — T3110 Burns involving 10-19% of body surface with 0% to 9% third degree burns: Secondary | ICD-10-CM | POA: Diagnosis not present

## 2020-04-20 DIAGNOSIS — H25813 Combined forms of age-related cataract, bilateral: Secondary | ICD-10-CM | POA: Diagnosis not present

## 2020-04-20 DIAGNOSIS — H25811 Combined forms of age-related cataract, right eye: Secondary | ICD-10-CM | POA: Diagnosis not present

## 2020-04-20 DIAGNOSIS — H02834 Dermatochalasis of left upper eyelid: Secondary | ICD-10-CM | POA: Diagnosis not present

## 2020-04-20 DIAGNOSIS — H01001 Unspecified blepharitis right upper eyelid: Secondary | ICD-10-CM | POA: Diagnosis not present

## 2020-04-20 DIAGNOSIS — H02831 Dermatochalasis of right upper eyelid: Secondary | ICD-10-CM | POA: Diagnosis not present

## 2020-04-21 NOTE — H&P (Signed)
Surgical History & Physical  Patient Name: Frank Powell DOB: [birth year]  Surgery: Cataract extraction with intraocular lens implant phacoemulsification; Right Eye  Surgeon: Fabio Pierce MD Surgery Date:  04/27/2020 Pre-Op Date:  04/20/2020  HPI: A 14 Yr. old male patient is referred by Dr Olena Le[facility] ([facility]) for cataract eval. 1. The patient complains of difficulty when driving, which began many years ago. Both eyes are affected. The episode is gradual. The condition's severity increased since last visit. Symptoms occur when the patient is driving, inside and outside. Pt states he does not drive at night due to glare, tis is negatively affecting his quality of life. HPI was performed by Fabio Pierce .  Medical History: Cataracts Diabetes Heart Problem Stroke, Diabetic Neuropathy  Review of Systems Cardiovascular Pacemaker/defibrillator All recorded systems are negative except as noted above.  Social   Current some day smoker of Cigarettes 1/2 pack a day    Medication Jardiance, Entresto, Tresiba, Insulin, Isosorbide Mononitrate, Gabapentin, Glimepiride, Atorvastatin, Combivent, Potassium, Gemfibrozil, Clopidogrel, Trazodone, Metoprolol,   Sx/Procedures Pacemaker, Stent sx for heart,   Drug Allergies   NKDA  History & Physical: Heent:  Cataract, Right eye NECK: supple without bruits LUNGS: lungs clear to auscultation CV: regular rate and rhythm Abdomen: soft and non-tender  Impression & Plan: Assessment: 1.  COMBINED FORMS AGE RELATED CATARACT; Both Eyes (H25.813) 2.  DERMATOCHALASIS, no surgery; Right Upper Lid, Left Upper Lid (H02.831, H02.834) 3.  BLEPHARITIS; Right Upper Lid, Right Lower Lid, Left Upper Lid, Left Lower Lid (H01.001, H01.002,H01.004,H01.005)  Plan: 1.  Cataract accounts for the patient's decreased vision. This visual impairment is not correctable with a tolerable change in glasses or contact lenses. Cataract surgery with an  implantation of a new lens should significantly improve the visual and functional status of the patient. Discussed all risks, benefits, alternatives, and potential complications. Discussed the procedures and recovery. Patient desires to have surgery. A-scan ordered and performed today for intra-ocular lens calculations. The surgery will be performed in order to improve vision for driving, reading, and for eye examinations. Recommend phacoemulsification with intra-ocular lens. Recommend Dextenza for post-operative pain and inflammation. Right Eye. Dilates poorly - shugacaine by protocol. Omidira. 2.  Asymptomatic, recommend observation for now. Findings, prognosis and treatment options reviewed. 3.  Begin/continue lid scrubs.

## 2020-04-22 NOTE — Patient Instructions (Signed)
    Frank Powell  04/22/2020     @PREFPERIOPPHARMACY @   Your procedure is scheduled on 04/27/2020.  Report to 06/27/2020 at  [phone]  A.M.  Call this number if you have problems the morning of surgery:  [phone]   Remember:  Do not eat or drink after midnight.                        Take these medicines the morning of surgery with A SIP OF WATER  Xanax, entre4sto, gabapentin, metoprolol. Use your inhalers before you come. If you take your insulin at night, take 1/2 of your usual dose. DO NOT take any medications for diabetes the morning of your procedure.    Do not wear jewelry, make-up or nail polish.  Do not wear lotions, powders, or perfumes. Please wear deodorant and brush your teeth.  Do not shave 48 hours prior to surgery.  Men may shave face and neck.  Do not bring valuables to the hospital.  [facility] is not responsible for any belongings or valuables.  Contacts, dentures or bridgework may not be worn into surgery.  Leave your suitcase in the car.  After surgery it may be brought to your room.  For patients admitted to the hospital, discharge time will be determined by your treatment team.  Patients discharged the day of surgery will not be allowed to drive home.   Name and phone number of your driver:   family Special instructions:   DO NOT smoke the morning of your procedure.  Please read over the following fact sheets that you were given. Anesthesia Post-op Instructions and Care and Recovery After Surgery

## 2020-04-23 ENCOUNTER — Encounter (HOSPITAL_COMMUNITY): Payer: Self-pay

## 2020-04-23 ENCOUNTER — Encounter (HOSPITAL_COMMUNITY)
Admission: RE | Admit: 2020-04-23 | Discharge: 2020-04-23 | Disposition: A | Discharge status: Home / Self Care | Payer: Medicare Other | Source: Ambulatory Visit | Attending: Ophthalmology | Admitting: Ophthalmology

## 2020-04-23 ENCOUNTER — Other Ambulatory Visit (HOSPITAL_COMMUNITY)
Admission: RE | Admit: 2020-04-23 | Discharge: 2020-04-23 | Disposition: A | Discharge status: Home / Self Care | Payer: Medicare Other | Source: Ambulatory Visit | Attending: Ophthalmology | Admitting: Ophthalmology

## 2020-04-23 ENCOUNTER — Other Ambulatory Visit: Payer: Self-pay

## 2020-04-23 DIAGNOSIS — Z01812 Encounter for preprocedural laboratory examination: Secondary | ICD-10-CM | POA: Insufficient documentation

## 2020-04-23 DIAGNOSIS — Z20822 Contact with and (suspected) exposure to covid-19: Secondary | ICD-10-CM | POA: Diagnosis not present

## 2020-04-23 LAB — BASIC METABOLIC PANEL
Anion gap: 9 (ref 5–15)
BUN: 7 mg/dL (ref 6–20)
CO2: 29 mmol/L (ref 22–32)
Calcium: 8.6 mg/dL — ABNORMAL LOW (ref 8.9–10.3)
Chloride: 90 mmol/L — ABNORMAL LOW (ref 98–111)
Creatinine, Ser: 0.96 mg/dL (ref 0.61–1.24)
GFR calc non Af Amer: >60 mL/min (ref >60)
Glucose, Bld: 465 mg/dL — ABNORMAL HIGH (ref 70–99)
Potassium: 4 mmol/L (ref 3.5–5.1)
Sodium: 128 mmol/L — ABNORMAL LOW (ref 135–145)

## 2020-04-23 LAB — SARS CORONAVIRUS 2 (TAT 6-24 HRS): SARS Coronavirus 2: NEGATIVE

## 2020-04-23 LAB — HEMOGLOBIN A1C
Hgb A1c MFr Bld: 11.5 % — ABNORMAL HIGH (ref 4.8–5.6)
Mean Plasma Glucose: 283.35 mg/dL

## 2020-04-24 MED ORDER — TETRACAINE HCL 0.5 % OP SOLN
OPHTHALMIC | Status: AC
  Filled 2020-04-24: qty 4

## 2020-04-24 MED ORDER — LIDOCAINE HCL 3.5 % OP GEL
OPHTHALMIC | Status: AC
  Filled 2020-04-24: qty 1

## 2020-04-24 MED ORDER — CYCLOPENTOLATE-PHENYLEPHRINE 0.2-1 % OP SOLN
OPHTHALMIC | Status: AC
  Filled 2020-04-24: qty 2

## 2020-04-24 MED ORDER — LIDOCAINE HCL (PF) 1 % IJ SOLN
INTRAMUSCULAR | Status: AC
  Filled 2020-04-24: qty 2

## 2020-04-24 MED ORDER — NEOMYCIN-POLYMYXIN-DEXAMETH 3.5-10000-0.1 OP SUSP
OPHTHALMIC | Status: AC
  Filled 2020-04-24: qty 5

## 2020-04-24 MED ORDER — PHENYLEPHRINE HCL 2.5 % OP SOLN
OPHTHALMIC | Status: AC
  Filled 2020-04-24: qty 15

## 2020-04-24 NOTE — Progress Notes (Addendum)
Glucose 465 reported to Dr Johnnette Litter.  Dr Johnnette Litter wanted patient to be called and instruct patient to take his insulin and not miss any doses and to take his full dose of the night before procedure.  CBG on arrival.  Unable to reach Frank Powell so I called and talked with Frank Powell patient's brother.  Dr Kandy Garrison office scheduler, Frank Powell notified.

## 2020-04-24 NOTE — OR Nursing (Signed)
Na+ 128 ,  Also reported to Dr. Johnnette Litter.  No further orders

## 2020-04-27 ENCOUNTER — Ambulatory Visit (HOSPITAL_COMMUNITY)
Admission: RE | Admit: 2020-04-27 | Discharge: 2020-04-27 | Disposition: A | Discharge status: Home / Self Care | Payer: Medicare Other | Attending: Ophthalmology | Admitting: Ophthalmology

## 2020-04-27 ENCOUNTER — Ambulatory Visit (HOSPITAL_COMMUNITY): Payer: Medicare Other | Admitting: Anesthesiology

## 2020-04-27 ENCOUNTER — Other Ambulatory Visit: Payer: Self-pay

## 2020-04-27 ENCOUNTER — Encounter (HOSPITAL_COMMUNITY): Payer: Self-pay | Admitting: Ophthalmology

## 2020-04-27 ENCOUNTER — Encounter (HOSPITAL_COMMUNITY)
Admission: RE | Disposition: A | Discharge status: Home / Self Care | Payer: Self-pay | Source: Home / Self Care | Attending: Ophthalmology

## 2020-04-27 DIAGNOSIS — Z8673 Personal history of transient ischemic attack (TIA), and cerebral infarction without residual deficits: Secondary | ICD-10-CM | POA: Diagnosis not present

## 2020-04-27 DIAGNOSIS — Z7901 Long term (current) use of anticoagulants: Secondary | ICD-10-CM | POA: Insufficient documentation

## 2020-04-27 DIAGNOSIS — Z955 Presence of coronary angioplasty implant and graft: Secondary | ICD-10-CM | POA: Diagnosis not present

## 2020-04-27 DIAGNOSIS — I110 Hypertensive heart disease with heart failure: Secondary | ICD-10-CM | POA: Insufficient documentation

## 2020-04-27 DIAGNOSIS — I25119 Atherosclerotic heart disease of native coronary artery with unspecified angina pectoris: Secondary | ICD-10-CM | POA: Diagnosis not present

## 2020-04-27 DIAGNOSIS — Z950 Presence of cardiac pacemaker: Secondary | ICD-10-CM | POA: Insufficient documentation

## 2020-04-27 DIAGNOSIS — F319 Bipolar disorder, unspecified: Secondary | ICD-10-CM | POA: Insufficient documentation

## 2020-04-27 DIAGNOSIS — Z794 Long term (current) use of insulin: Secondary | ICD-10-CM | POA: Insufficient documentation

## 2020-04-27 DIAGNOSIS — Z79899 Other long term (current) drug therapy: Secondary | ICD-10-CM | POA: Insufficient documentation

## 2020-04-27 DIAGNOSIS — J449 Chronic obstructive pulmonary disease, unspecified: Secondary | ICD-10-CM | POA: Diagnosis not present

## 2020-04-27 DIAGNOSIS — H0100B Unspecified blepharitis left eye, upper and lower eyelids: Secondary | ICD-10-CM | POA: Insufficient documentation

## 2020-04-27 DIAGNOSIS — H0100A Unspecified blepharitis right eye, upper and lower eyelids: Secondary | ICD-10-CM | POA: Insufficient documentation

## 2020-04-27 DIAGNOSIS — H02834 Dermatochalasis of left upper eyelid: Secondary | ICD-10-CM | POA: Diagnosis not present

## 2020-04-27 DIAGNOSIS — E1140 Type 2 diabetes mellitus with diabetic neuropathy, unspecified: Secondary | ICD-10-CM | POA: Insufficient documentation

## 2020-04-27 DIAGNOSIS — F17210 Nicotine dependence, cigarettes, uncomplicated: Secondary | ICD-10-CM | POA: Insufficient documentation

## 2020-04-27 DIAGNOSIS — E119 Type 2 diabetes mellitus without complications: Secondary | ICD-10-CM | POA: Diagnosis not present

## 2020-04-27 DIAGNOSIS — H02831 Dermatochalasis of right upper eyelid: Secondary | ICD-10-CM | POA: Insufficient documentation

## 2020-04-27 DIAGNOSIS — H25811 Combined forms of age-related cataract, right eye: Secondary | ICD-10-CM | POA: Insufficient documentation

## 2020-04-27 DIAGNOSIS — I252 Old myocardial infarction: Secondary | ICD-10-CM | POA: Diagnosis not present

## 2020-04-27 DIAGNOSIS — I509 Heart failure, unspecified: Secondary | ICD-10-CM | POA: Diagnosis not present

## 2020-04-27 DIAGNOSIS — E1136 Type 2 diabetes mellitus with diabetic cataract: Secondary | ICD-10-CM | POA: Insufficient documentation

## 2020-04-27 DIAGNOSIS — I4891 Unspecified atrial fibrillation: Secondary | ICD-10-CM | POA: Diagnosis not present

## 2020-04-27 HISTORY — PX: CATARACT EXTRACTION W/PHACO: SHX586

## 2020-04-27 LAB — GLUCOSE, CAPILLARY: Glucose-Capillary: 300 mg/dL — ABNORMAL HIGH (ref 70–99)

## 2020-04-27 SURGERY — PHACOEMULSIFICATION, CATARACT, WITH IOL INSERTION
Anesthesia: Monitor Anesthesia Care | Site: Eye | Laterality: Right

## 2020-04-27 MED ORDER — PHENYLEPHRINE HCL 2.5 % OP SOLN
1.0000 [drp] | OPHTHALMIC | Status: AC | PRN
  Administered 2020-04-27 (×3): 1 [drp] via OPHTHALMIC

## 2020-04-27 MED ORDER — TETRACAINE HCL 0.5 % OP SOLN
1.0000 [drp] | OPHTHALMIC | Status: AC | PRN
  Administered 2020-04-27 (×3): 1 [drp] via OPHTHALMIC

## 2020-04-27 MED ORDER — PROVISC 10 MG/ML IO SOLN
INTRAOCULAR | Status: DC | PRN
  Administered 2020-04-27: 0.85 mL via INTRAOCULAR

## 2020-04-27 MED ORDER — LIDOCAINE HCL 3.5 % OP GEL
1.0000 "application " | Freq: Once | OPHTHALMIC | Status: AC
  Administered 2020-04-27: 1 via OPHTHALMIC

## 2020-04-27 MED ORDER — PHENYLEPHRINE-KETOROLAC 1-0.3 % IO SOLN
INTRAOCULAR | Status: DC | PRN
  Administered 2020-04-27: 500 mL via OPHTHALMIC

## 2020-04-27 MED ORDER — EPINEPHRINE PF 1 MG/ML IJ SOLN
INTRAMUSCULAR | Status: AC
  Filled 2020-04-27: qty 1

## 2020-04-27 MED ORDER — PHENYLEPHRINE-KETOROLAC 1-0.3 % IO SOLN
INTRAOCULAR | Status: AC
  Filled 2020-04-27: qty 4

## 2020-04-27 MED ORDER — SODIUM HYALURONATE 23 MG/ML IO SOLN
INTRAOCULAR | Status: DC | PRN
  Administered 2020-04-27: 0.6 mL via INTRAOCULAR

## 2020-04-27 MED ORDER — NEOMYCIN-POLYMYXIN-DEXAMETH 3.5-10000-0.1 OP SUSP
OPHTHALMIC | Status: DC | PRN
  Administered 2020-04-27: 1 [drp] via OPHTHALMIC

## 2020-04-27 MED ORDER — LIDOCAINE HCL (PF) 1 % IJ SOLN
INTRAOCULAR | Status: DC | PRN
  Administered 2020-04-27: 1 mL via OPHTHALMIC

## 2020-04-27 MED ORDER — CYCLOPENTOLATE-PHENYLEPHRINE 0.2-1 % OP SOLN
1.0000 [drp] | OPHTHALMIC | Status: AC | PRN
  Administered 2020-04-27 (×3): 1 [drp] via OPHTHALMIC

## 2020-04-27 MED ORDER — BSS IO SOLN
INTRAOCULAR | Status: DC | PRN
  Administered 2020-04-27: 15 mL via INTRAOCULAR

## 2020-04-27 MED ORDER — POVIDONE-IODINE 5 % OP SOLN
OPHTHALMIC | Status: DC | PRN
  Administered 2020-04-27: 1 via OPHTHALMIC

## 2020-04-27 SURGICAL SUPPLY — 15 items
CLOTH BEACON ORANGE TIMEOUT ST (SAFETY) ×1 IMPLANT
EYE SHIELD UNIVERSAL CLEAR (GAUZE/BANDAGES/DRESSINGS) ×1 IMPLANT
GLOVE BIOGEL PI IND STRL 7.0 (GLOVE) IMPLANT
GLOVE BIOGEL PI INDICATOR 7.0 (GLOVE) ×2
LENS ALC ACRYL/TECN (Ophthalmic Related) ×1 IMPLANT
NDL HYPO 18GX1.5 BLUNT FILL (NEEDLE) IMPLANT
NEEDLE HYPO 18GX1.5 BLUNT FILL (NEEDLE) ×2 IMPLANT
PAD ARMBOARD 7.5X6 YLW CONV (MISCELLANEOUS) ×1 IMPLANT
RING MALYGIN (MISCELLANEOUS) IMPLANT
RING MALYGIN 7.0 (MISCELLANEOUS) IMPLANT
SYR TB 1ML LL NO SAFETY (SYRINGE) ×1 IMPLANT
TAPE SURG TRANSPORE 1 IN (GAUZE/BANDAGES/DRESSINGS) IMPLANT
TAPE SURGICAL TRANSPORE 1 IN (GAUZE/BANDAGES/DRESSINGS) ×2
VISCOELASTIC ADDITIONAL (OPHTHALMIC RELATED) ×1 IMPLANT
WATER STERILE IRR 250ML POUR (IV SOLUTION) ×1 IMPLANT

## 2020-04-27 NOTE — Discharge Instructions (Signed)
Please discharge patient when stable, will follow up today with Dr. Anjelita Sheahan at the [facility] office immediately following discharge.  Leave shield in place until visit.  All paperwork with discharge instructions will be given at the office.  [facility] Address:  [address]  [place],  [address]  

## 2020-04-27 NOTE — Transfer of Care (Signed)
Immediate Anesthesia Transfer of Care Note  Patient: Frank Powell  Procedure(s) Performed: CATARACT EXTRACTION PHACO AND INTRAOCULAR LENS PLACEMENT RIGHT EYE (Right Eye)  Patient Location: Short Stay  Anesthesia Type:MAC  Level of Consciousness: awake, alert  and patient cooperative  Airway & Oxygen Therapy: Patient Spontanous Breathing  Post-op Assessment: Report given to RN and Post -op Vital signs reviewed and stable  Post vital signs: Reviewed and stable  Last Vitals:  Vitals Value Taken Time  BP    Temp    Pulse    Resp    SpO2      Last Pain:  Vitals:   04/27/20 0731  TempSrc: Oral  PainSc: 4          Complications: No complications documented.

## 2020-04-27 NOTE — Interval H&P Note (Signed)
History and Physical Interval Note:  04/27/2020 7:57 AM  Frank Powell  has presented today for surgery, with the diagnosis of Nuclear sclerotic cataract - Right eye.  The various methods of treatment have been discussed with the patient and family. After consideration of risks, benefits and other options for treatment, the patient has consented to  Procedure(s) with comments: CATARACT EXTRACTION PHACO AND INTRAOCULAR LENS PLACEMENT RIGHT EYE (Right) - right as a surgical intervention.  The patient's history has been reviewed, patient examined, no change in status, stable for surgery.  I have reviewed the patient's chart and labs.  Questions were answered to the patient's satisfaction.     Fabio Pierce

## 2020-04-27 NOTE — Anesthesia Procedure Notes (Signed)
Procedure Name: MAC Date/Time: 04/27/2020 7:58 AM Performed by: Vista Deck, CRNA Pre-anesthesia Checklist: Patient identified, Emergency Drugs available, Suction available, Timeout performed and Patient being monitored Patient Re-evaluated:Patient Re-evaluated prior to induction Oxygen Delivery Method: Nasal Cannula

## 2020-04-27 NOTE — Anesthesia Preprocedure Evaluation (Addendum)
Anesthesia Evaluation  Patient identified by MRN, date of birth, ID band Patient awake    Reviewed: Allergy & Precautions, NPO status , Patient's Chart, lab work & pertinent test results, reviewed documented beta blocker date and time   History of Anesthesia Complications Negative for: history of anesthetic complications  Airway Mallampati: II  TM Distance: >3 FB Neck ROM: Full    Dental  (+) Edentulous Upper, Edentulous Lower   Pulmonary shortness of breath and with exertion, COPD,  COPD inhaler, Current SmokerPatient did not abstain from smoking.,    Pulmonary exam normal breath sounds clear to auscultation       Cardiovascular Exercise Tolerance: Poor hypertension, Pt. on home beta blockers + angina + CAD, + Past MI, + Cardiac Stents (stopped plavix, noncomplaint with meds), +CHF (cardiomyopathy - EF 35%) and + DOE  Normal cardiovascular exam+ dysrhythmias Atrial Fibrillation + pacemaker  Rhythm:Regular Rate:Normal  PROLONGED QT   Neuro/Psych PSYCHIATRIC DISORDERS Bipolar Disorder CVA, Residual Symptoms    GI/Hepatic Neg liver ROS, GERD  Medicated and Controlled,  Endo/Other  diabetes, Poorly Controlled, Type 2, Insulin Dependent  Renal/GU negative Renal ROS     Musculoskeletal  (+) Arthritis  (Back pain),   Abdominal   Peds  Hematology negative hematology ROS (+)   Anesthesia Other Findings Discussed with Dr. Marisa Hua regarding his cardiac status and uncontrolled blood sugar.  Reproductive/Obstetrics negative OB ROS                           Anesthesia Physical Anesthesia Plan  ASA: IV  Anesthesia Plan: MAC   Post-op Pain Management:    Induction:   PONV Risk Score and Plan:   Airway Management Planned: Nasal Cannula and Natural Airway  Additional Equipment:   Intra-op Plan:   Post-operative Plan:   Informed Consent: I have reviewed the patients History and Physical,  chart, labs and discussed the procedure including the risks, benefits and alternatives for the proposed anesthesia with the patient or authorized representative who has indicated his/her understanding and acceptance.       Plan Discussed with: CRNA and Surgeon  Anesthesia Plan Comments:        Anesthesia Quick Evaluation

## 2020-04-27 NOTE — Anesthesia Postprocedure Evaluation (Signed)
Anesthesia Post Note  Patient: Frank Powell  Procedure(s) Performed: CATARACT EXTRACTION PHACO AND INTRAOCULAR LENS PLACEMENT RIGHT EYE (Right Eye)  Patient location during evaluation: Short Stay Anesthesia Type: MAC Level of consciousness: awake and alert and patient cooperative Pain management: satisfactory to patient Vital Signs Assessment: post-procedure vital signs reviewed and stable Respiratory status: spontaneous breathing Cardiovascular status: stable Postop Assessment: no apparent nausea or vomiting Anesthetic complications: no   No complications documented.   Last Vitals:  Vitals:   04/27/20 0731 04/27/20 0825  BP: 98/66 (!) 107/57  Pulse:  (!) 59  Resp: 15 19  Temp: 36.4 C 36.6 C  SpO2: 96% 97%    Last Pain:  Vitals:   04/27/20 0825  TempSrc: Oral  PainSc: 0-No pain                 Zyair Russi

## 2020-04-27 NOTE — Op Note (Signed)
Date of procedure: 04/27/20  Pre-operative diagnosis:  Visually significant combined form age-related cataract, Right Eye (H25.811)  Post-operative diagnosis:  Visually significant combined form age-related cataract, Right Eye (H25.811)  Procedure: Removal of cataract via phacoemulsification and insertion of intra-ocular lens [name] and [employer] DCB00  +21.5D into the capsular bag of the Right Eye  Attending surgeon: Gerda Diss. Dorinda Stehr, MD, MA  Anesthesia: MAC, Topical Akten  Complications: None  Estimated Blood Loss: <10m (minimal)  Specimens: None  Implants: As above  Indications:  Visually significant age-related cataract, Right Eye  Procedure:  The patient was seen and identified in the pre-operative area. The operative eye was identified and dilated.  The operative eye was marked.  Topical anesthesia was administered to the operative eye.     The patient was then to the operative suite and placed in the supine position.  A timeout was performed confirming the patient, procedure to be performed, and all other relevant information.   The patient's face was prepped and draped in the usual fashion for intra-ocular surgery.  A lid speculum was placed into the operative eye and the surgical microscope moved into place and focused.  A superotemporal paracentesis was created using a 20 gauge paracentesis blade.  Shugarcaine was injected into the anterior chamber.  Viscoelastic was injected into the anterior chamber.  A temporal clear-corneal main wound incision was created using a 2.477mmicrokeratome.  A continuous curvilinear capsulorrhexis was initiated using an irrigating cystitome and completed using capsulorrhexis forceps.  Hydrodissection and hydrodeliniation were performed.  Viscoelastic was injected into the anterior chamber.  A phacoemulsification handpiece and a chopper as a second instrument were used to remove the nucleus and epinucleus. The irrigation/aspiration handpiece was  used to remove any remaining cortical material.   The capsular bag was reinflated with viscoelastic, checked, and found to be intact.  The intraocular lens was inserted into the capsular bag.  The irrigation/aspiration handpiece was used to remove any remaining viscoelastic.  The clear corneal wound and paracentesis wounds were then hydrated and checked with Weck-Cels to be watertight.  The lid-speculum was removed.  The drape was removed.  The patient's face was cleaned with a wet and dry 4x4.   Maxitrol was instilled in the eye. A clear shield was taped over the eye. The patient was taken to the post-operative care unit in good condition, having tolerated the procedure well.  Post-Op Instructions: [facility] and will receive all other orders and instructions.

## 2020-04-28 ENCOUNTER — Encounter (HOSPITAL_COMMUNITY): Payer: Self-pay | Admitting: Ophthalmology

## 2020-04-30 ENCOUNTER — Ambulatory Visit (INDEPENDENT_AMBULATORY_CARE_PROVIDER_SITE_OTHER): Payer: Medicare Other | Admitting: Orthopaedic Surgery

## 2020-04-30 ENCOUNTER — Ambulatory Visit: Payer: Self-pay

## 2020-04-30 ENCOUNTER — Encounter: Payer: Self-pay | Admitting: Orthopaedic Surgery

## 2020-04-30 VITALS — Ht 70.0 in | Wt 214.0 lb

## 2020-04-30 DIAGNOSIS — M25522 Pain in left elbow: Secondary | ICD-10-CM | POA: Diagnosis not present

## 2020-04-30 DIAGNOSIS — M7021 Olecranon bursitis, right elbow: Secondary | ICD-10-CM | POA: Diagnosis not present

## 2020-04-30 MED ORDER — LIDOCAINE HCL 1 % IJ SOLN
0.5000 mL | INTRAMUSCULAR | Status: AC | PRN
  Administered 2020-04-30: .5 mL

## 2020-04-30 NOTE — Progress Notes (Signed)
Office Visit Note   Patient: Frank Powell           Date of Birth: 05-27-1961           MRN: [id number] Visit Date: 04/30/2020              Requested by: Toma Deiters, MD [address] [place],  [place] [address] PCP: Toma Deiters, MD   Assessment & Plan: Visit Diagnoses:  1. Pain in left elbow   2. Olecranon bursitis, right elbow     Plan: Olecranon fluid was bloody.  40 cc removed.  Follow-up as needed.  Patient is on Plavix.  We discussed making sure his elbow is on only soft services not hard services which is likely contributing to the recurrent bursitis.  Follow-Up Instructions: Return if symptoms worsen or fail to improve.   Orders:  Orders Placed This Encounter  Procedures  . Medium Joint Inj  . XR Elbow 2 Views Left   No orders of the defined types were placed in this encounter.     Procedures: Medium Joint Inj: L olecranon bursa on 04/30/2020 3:14 PM Indications: pain Details: 22 G 1.5 in needle, anterolateral approach Medications: 0.5 mL lidocaine 1 % Aspirate: 40 mL bloody Outcome: tolerated well, no immediate complications Procedure, treatment alternatives, risks and benefits explained, specific risks discussed. Consent was given by the patient. Immediately prior to procedure a time out was called to verify the correct patient, procedure, equipment, support staff and site/side marked as required. Patient was prepped and draped in the usual sterile fashion.       Clinical Data: No additional findings.   Subjective: Chief Complaint  Patient presents with  . Left Elbow - Pain    HPI 59 year old male multiple medical problems with noncompliant diabetes A1c greater than 11 previous pacemaker MI, bipolar disorder seen with left olecranon bursitis with 2 areas bigger than a ping-pong ball.  He has had discomfort.  He has blisters on his fingers from neuropathy in his hands and cigarette burns.  He has noted hand weakness with interosseous wasting  right and left.  Review of Systems positive for heart trouble, tobacco abuse, pacemaker, previous MI, bipolar disorder, poor compliance, diabetes poorly controlled type II.   Objective: Vital Signs: Ht 5\' 10"  (1.778 m)   Wt 214 lb (97.1 kg)   BMI 30.71 kg/m   Physical Exam Constitutional:      Appearance: He is well-developed.  HENT:     Head: Normocephalic and atraumatic.  Eyes:     Pupils: Pupils are equal, round, and reactive to light.  Neck:     Thyroid: No thyromegaly.     Trachea: No tracheal deviation.  Cardiovascular:     Rate and Rhythm: Normal rate.  Pulmonary:     Effort: Pulmonary effort is normal.     Breath sounds: No wheezing.  Abdominal:     General: Bowel sounds are normal.     Palpations: Abdomen is soft.  Skin:    General: Skin is warm and dry.     Capillary Refill: Capillary refill takes less than 2 seconds.  Neurological:     Mental Status: He is alert and oriented to person, place, and time.  Psychiatric:        Behavior: Behavior normal.        Thought Content: Thought content normal.        Judgment: Judgment normal.     Ortho Exam left elbow with olecranon  bursitis which appears to be 2 separate adjacent areas larger than a ping-pong ball.  Intrinsic wasting right and left first dorsal interosseous weakness with flexors.  Discoloration from burns on his fingers both hands from neuropathy and holding a cigarette until it burns his fingers.  Specialty Comments:  No specialty comments available.  Imaging: XR Elbow 2 Views Left  Result Date: 04/30/2020 2 view x-rays left elbow obtained and reviewed.  This shows normal bone architecture but large prominent olecranon bursitis seen on both AP and lateral images.  No triceps tendinopathy or calcific tendinopathy. Impression: Normal left  elbow joint with olecranon bursitis.    PMFS History: Patient Active Problem List   Diagnosis Date Noted  . Olecranon bursitis, right elbow 04/30/2020  .  Paroxysmal atrial fibrillation (HCC) 10/04/2016  . Acute MI, inferior wall (HCC)   . Possible unstable angina pectoris (HCC) 08/26/2015  . Tobacco abuse 08/26/2015  . CAD S/P LAD DES 03/21/15 03/23/2015  . Bipolar affective disorder (HCC) 03/23/2015  . Non-compliance-left [facility] 05/26/16 03/23/2015  . Cardiomyopathy, ischemic-EF 35-40% 03/23/2015  . ST elevation (STEMI) myocardial infarction involving left anterior descending coronary artery (HCC) 03/21/2015  . Neurocardiogenic syncope 06/02/2013  . Pacemaker - Medtronic adaptadual-chamber implanted 2004, generator change 2014 06/02/2013  . DM2 (diabetes mellitus, type 2) (HCC) 06/02/2013  . Dyslipidemia 06/02/2013  . Prolonged QT interval 06/02/2013   Past Medical History:  Diagnosis Date  . Atrial fib/flutter, transient   . Bipolar 1 disorder (HCC)   . COPD (chronic obstructive pulmonary disease) (HCC)   . Coronary artery disease   . DM2 (diabetes mellitus, type 2) (HCC) 06/02/2013  . GERD (gastroesophageal reflux disease)   . Hypertriglyceridemia 06/02/2013  . Multiple personality (HCC)   . Myocardial infarct (HCC) 03/21/2015   DES LAD x3  . Neurocardiogenic syncope 06/02/2013  . Obesity (BMI 30.0-34.9) 06/02/2013  . Pacemaker 06/02/2013    Family History  Problem Relation Age of Onset  . Coronary artery disease Father   . Diabetes Father   . Diabetes Mother     Past Surgical History:  Procedure Laterality Date  . CARDIAC CATHETERIZATION  09/26/2002   normal coronary arteries and LV  . CARDIAC CATHETERIZATION N/A 03/21/2015   Procedure: Left Heart Cath and Coronary Angiography;  Surgeon: Lennette Bihari, MD; mLAD 100%, dLAD 80%, D2 80%, EF 50-55%  . CARDIAC CATHETERIZATION N/A 03/21/2015   Procedure: Coronary Stent Intervention;  Surgeon: Lennette Bihari, MD; 3.538 mm Xience Alpine DES to the LAD   . CARDIAC CATHETERIZATION N/A 05/25/2016   Procedure: Left Heart Cath and Coronary Angiography;  Surgeon: Corky Crafts, MD;   Location: [facility] INVASIVE CV LAB;  Service: Cardiovascular;  Laterality: N/A;  . CARDIAC CATHETERIZATION N/A 05/25/2016   Procedure: Coronary Stent Intervention;  Surgeon: Corky Crafts, MD;  Location: [facility] INVASIVE CV LAB;  Service: Cardiovascular;  Laterality: N/A;  . CATARACT EXTRACTION W/PHACO Right 04/27/2020   Procedure: CATARACT EXTRACTION PHACO AND INTRAOCULAR LENS PLACEMENT RIGHT EYE;  Surgeon: Fabio Pierce, MD;  Location: [facility];  Service: Ophthalmology;  Laterality: Right;  CDE: 11.27  . NM MYOCAR PERF WALL MOTION  10/06/2011   No ischemia  . PACEMAKER INSERTION Left 02/03/2003   Medtronic  . PERMANENT PACEMAKER GENERATOR CHANGE N/A 09/28/2012   Procedure: PERMANENT PACEMAKER GENERATOR CHANGE;  Surgeon: Thurmon Fair, MD; Medtronic    Social History   Occupational History  . Occupation: Disabled  Tobacco Use  . Smoking status: Current Every Day Smoker  Packs/day: 1.50    Years: 45.00    Pack years: 67.50    Types: Cigarettes  . Smokeless tobacco: Never Used  Vaping Use  . Vaping Use: Never used  Substance and Sexual Activity  . Alcohol use: No  . Drug use: No  . Sexual activity: Not Currently

## 2020-05-04 DIAGNOSIS — H25812 Combined forms of age-related cataract, left eye: Secondary | ICD-10-CM | POA: Diagnosis not present

## 2020-05-05 NOTE — H&P (Signed)
Surgical History & Physical  Patient Name: Frank Powell DOB: 09/19/1960  Surgery: Cataract extraction with intraocular lens implant phacoemulsification; Left Eye  Surgeon: Fabio Pierce MD Surgery Date:  05/11/2020 Pre-Op Date:  05/04/2020  HPI: A 45 Yr. old male patient 1. 1. The patient complains of difficulty when driving, which began many years ago. The left eye is affected. The episode is gradual. The condition's severity increased since last visit. Symptoms occur when the patient is driving, inside and outside. This is negatively affecting the patient's quality of life. 2. The patient is returning after cataract post-op. The right eye is affected. Status post cataract post-op, which began 1 week ago: Since the last visit, the affected area is doing well. The patient's vision is improved. Patient is following medication instructions. HPI was performed by Fabio Pierce .  Medical History: Cataracts Diabetes Heart Problem Stroke, Diabetic Neuropathy  Review of Systems Cardiovascular Pacemaker/defibrillator All recorded systems are negative except as noted above.  Social   Current some day smoker of Cigarettes 1/2 pack a day   Medication Jardiance, Entresto, Tresiba, Insulin, Isosorbide Mononitrate, Gabapentin, Glimepiride, Atorvastatin, Combivent, Potassium, Gemfibrozil, Clopidogrel, Trazodone, Metoprolol,   Sx/Procedures Phaco c IOL OD,  Pacemaker, Stent sx for heart,   Drug Allergies   NKDA  History & Physical: Heent:  Cataract, Left eye NECK: supple without bruits LUNGS: lungs clear to auscultation CV: regular rate and rhythm Abdomen: soft and non-tender  Impression & Plan: Assessment: 1.  CATARACT EXTRACTION STATUS; Right Eye (Z98.41) 2.  INTRAOCULAR LENS IOL (Z96.1) 3.  COMBINED FORMS AGE RELATED CATARACT; Both Eyes (H25.813)  Plan: 1.  1 week after cataract surgery. Doing well with improved vision and normal eye pressure. Call with any problems or  concerns. Stop Vigamox. Continue Ilevro 1 drop 1x/day for 3 more weeks. Continue Pred Acetate 1 drop 2x/day for 3 more weeks. 2.  Stable. Doing well since surgery Continue Post-op medications 3.  Dilates poorly - shugacaine by protocol. Omidira. Cataract accounts for the patient's decreased vision. This visual impairment is not correctable with a tolerable change in glasses or contact lenses. Cataract surgery with an implantation of a new lens should significantly improve the visual and functional status of the patient. Discussed all risks, benefits, alternatives, and potential complications. Discussed the procedures and recovery. Patient desires to have surgery. A-scan ordered and performed today for intra-ocular lens calculations. The surgery will be performed in order to improve vision for driving, reading, and for eye examinations. Recommend phacoemulsification with intra-ocular lens. Recommend Dextenza for post-operative pain and inflammation. Left Eye. Surgery required to correct imbalance of vision.

## 2020-05-07 ENCOUNTER — Encounter (HOSPITAL_COMMUNITY)
Admit: 2020-05-07 | Discharge: 2020-05-07 | Disposition: A | Discharge status: Home / Self Care | Payer: Medicare Other | Attending: Ophthalmology | Admitting: Ophthalmology

## 2020-05-07 ENCOUNTER — Other Ambulatory Visit: Payer: Self-pay

## 2020-05-08 ENCOUNTER — Other Ambulatory Visit (HOSPITAL_COMMUNITY)
Admit: 2020-05-08 | Discharge: 2020-05-08 | Disposition: A | Discharge status: Home / Self Care | Payer: Medicare Other | Source: Ambulatory Visit | Attending: Ophthalmology | Admitting: Ophthalmology

## 2020-05-08 ENCOUNTER — Other Ambulatory Visit: Payer: Self-pay

## 2020-05-08 DIAGNOSIS — Z01812 Encounter for preprocedural laboratory examination: Secondary | ICD-10-CM | POA: Diagnosis present

## 2020-05-08 DIAGNOSIS — Z20822 Contact with and (suspected) exposure to covid-19: Secondary | ICD-10-CM | POA: Diagnosis not present

## 2020-05-09 LAB — SARS CORONAVIRUS 2 (TAT 6-24 HRS): SARS Coronavirus 2: NEGATIVE

## 2020-05-11 ENCOUNTER — Ambulatory Visit (HOSPITAL_COMMUNITY): Payer: Medicare Other | Admitting: Anesthesiology

## 2020-05-11 ENCOUNTER — Other Ambulatory Visit: Payer: Self-pay | Admitting: Cardiovascular Disease

## 2020-05-11 ENCOUNTER — Encounter (HOSPITAL_COMMUNITY)
Admission: RE | Disposition: A | Discharge status: Home / Self Care | Payer: Self-pay | Source: Home / Self Care | Attending: Ophthalmology

## 2020-05-11 ENCOUNTER — Encounter (HOSPITAL_COMMUNITY): Payer: Self-pay | Admitting: Ophthalmology

## 2020-05-11 ENCOUNTER — Ambulatory Visit (HOSPITAL_COMMUNITY)
Admission: RE | Admit: 2020-05-11 | Discharge: 2020-05-11 | Disposition: A | Discharge status: Home / Self Care | Payer: Medicare Other | Attending: Ophthalmology | Admitting: Ophthalmology

## 2020-05-11 ENCOUNTER — Other Ambulatory Visit: Payer: Self-pay

## 2020-05-11 DIAGNOSIS — E1165 Type 2 diabetes mellitus with hyperglycemia: Secondary | ICD-10-CM | POA: Insufficient documentation

## 2020-05-11 DIAGNOSIS — I2510 Atherosclerotic heart disease of native coronary artery without angina pectoris: Secondary | ICD-10-CM | POA: Diagnosis not present

## 2020-05-11 DIAGNOSIS — E1136 Type 2 diabetes mellitus with diabetic cataract: Secondary | ICD-10-CM | POA: Diagnosis present

## 2020-05-11 DIAGNOSIS — K219 Gastro-esophageal reflux disease without esophagitis: Secondary | ICD-10-CM | POA: Insufficient documentation

## 2020-05-11 DIAGNOSIS — H25812 Combined forms of age-related cataract, left eye: Secondary | ICD-10-CM | POA: Insufficient documentation

## 2020-05-11 DIAGNOSIS — Z961 Presence of intraocular lens: Secondary | ICD-10-CM | POA: Diagnosis not present

## 2020-05-11 DIAGNOSIS — Z7984 Long term (current) use of oral hypoglycemic drugs: Secondary | ICD-10-CM | POA: Insufficient documentation

## 2020-05-11 DIAGNOSIS — I110 Hypertensive heart disease with heart failure: Secondary | ICD-10-CM | POA: Diagnosis not present

## 2020-05-11 DIAGNOSIS — F17210 Nicotine dependence, cigarettes, uncomplicated: Secondary | ICD-10-CM | POA: Diagnosis not present

## 2020-05-11 DIAGNOSIS — Z794 Long term (current) use of insulin: Secondary | ICD-10-CM | POA: Diagnosis not present

## 2020-05-11 DIAGNOSIS — J449 Chronic obstructive pulmonary disease, unspecified: Secondary | ICD-10-CM | POA: Insufficient documentation

## 2020-05-11 DIAGNOSIS — Z9841 Cataract extraction status, right eye: Secondary | ICD-10-CM | POA: Diagnosis not present

## 2020-05-11 DIAGNOSIS — I252 Old myocardial infarction: Secondary | ICD-10-CM | POA: Insufficient documentation

## 2020-05-11 DIAGNOSIS — Z955 Presence of coronary angioplasty implant and graft: Secondary | ICD-10-CM | POA: Insufficient documentation

## 2020-05-11 DIAGNOSIS — I25119 Atherosclerotic heart disease of native coronary artery with unspecified angina pectoris: Secondary | ICD-10-CM | POA: Diagnosis not present

## 2020-05-11 DIAGNOSIS — Z9114 Patient's other noncompliance with medication regimen: Secondary | ICD-10-CM | POA: Insufficient documentation

## 2020-05-11 DIAGNOSIS — Z8673 Personal history of transient ischemic attack (TIA), and cerebral infarction without residual deficits: Secondary | ICD-10-CM | POA: Insufficient documentation

## 2020-05-11 DIAGNOSIS — E1140 Type 2 diabetes mellitus with diabetic neuropathy, unspecified: Secondary | ICD-10-CM | POA: Diagnosis not present

## 2020-05-11 DIAGNOSIS — Z79899 Other long term (current) drug therapy: Secondary | ICD-10-CM | POA: Insufficient documentation

## 2020-05-11 DIAGNOSIS — Z95810 Presence of automatic (implantable) cardiac defibrillator: Secondary | ICD-10-CM | POA: Insufficient documentation

## 2020-05-11 DIAGNOSIS — I509 Heart failure, unspecified: Secondary | ICD-10-CM | POA: Diagnosis not present

## 2020-05-11 HISTORY — PX: CATARACT EXTRACTION W/PHACO: SHX586

## 2020-05-11 LAB — GLUCOSE, CAPILLARY: Glucose-Capillary: 166 mg/dL — ABNORMAL HIGH (ref 70–99)

## 2020-05-11 SURGERY — PHACOEMULSIFICATION, CATARACT, WITH IOL INSERTION
Anesthesia: Monitor Anesthesia Care | Site: Eye | Laterality: Left

## 2020-05-11 MED ORDER — TETRACAINE HCL 0.5 % OP SOLN
1.0000 [drp] | OPHTHALMIC | Status: AC | PRN
  Administered 2020-05-11 (×3): 1 [drp] via OPHTHALMIC

## 2020-05-11 MED ORDER — PHENYLEPHRINE HCL 2.5 % OP SOLN
1.0000 [drp] | OPHTHALMIC | Status: AC | PRN
  Administered 2020-05-11 (×3): 1 [drp] via OPHTHALMIC

## 2020-05-11 MED ORDER — LIDOCAINE HCL 3.5 % OP GEL
1.0000 "application " | Freq: Once | OPHTHALMIC | Status: AC
  Administered 2020-05-11: 1 via OPHTHALMIC

## 2020-05-11 MED ORDER — NEOMYCIN-POLYMYXIN-DEXAMETH 3.5-10000-0.1 OP SUSP
OPHTHALMIC | Status: DC | PRN
  Administered 2020-05-11: 1 [drp] via OPHTHALMIC

## 2020-05-11 MED ORDER — PHENYLEPHRINE-KETOROLAC 1-0.3 % IO SOLN
INTRAOCULAR | Status: AC
  Filled 2020-05-11: qty 4

## 2020-05-11 MED ORDER — POVIDONE-IODINE 5 % OP SOLN
OPHTHALMIC | Status: DC | PRN
  Administered 2020-05-11: 1 via OPHTHALMIC

## 2020-05-11 MED ORDER — PHENYLEPHRINE-KETOROLAC 1-0.3 % IO SOLN
INTRAOCULAR | Status: DC | PRN
  Administered 2020-05-11: 500 mL via OPHTHALMIC

## 2020-05-11 MED ORDER — BSS IO SOLN
INTRAOCULAR | Status: DC | PRN
  Administered 2020-05-11: 15 mL via INTRAOCULAR

## 2020-05-11 MED ORDER — LIDOCAINE HCL (PF) 1 % IJ SOLN
INTRAOCULAR | Status: DC | PRN
  Administered 2020-05-11: 1 mL via OPHTHALMIC

## 2020-05-11 MED ORDER — CYCLOPENTOLATE-PHENYLEPHRINE 0.2-1 % OP SOLN
1.0000 [drp] | OPHTHALMIC | Status: AC | PRN
  Administered 2020-05-11 (×3): 1 [drp] via OPHTHALMIC

## 2020-05-11 MED ORDER — SODIUM HYALURONATE 23 MG/ML IO SOLN
INTRAOCULAR | Status: DC | PRN
  Administered 2020-05-11: 0.6 mL via INTRAOCULAR

## 2020-05-11 MED ORDER — PROVISC 10 MG/ML IO SOLN
INTRAOCULAR | Status: DC | PRN
  Administered 2020-05-11: 0.85 mL via INTRAOCULAR

## 2020-05-11 SURGICAL SUPPLY — 18 items
CLOTH BEACON ORANGE TIMEOUT ST (SAFETY) ×1 IMPLANT
EYE SHIELD UNIVERSAL CLEAR (GAUZE/BANDAGES/DRESSINGS) ×1 IMPLANT
GLOVE BIOGEL PI IND STRL 7.0 (GLOVE) IMPLANT
GLOVE BIOGEL PI IND STRL 7.5 (GLOVE) IMPLANT
GLOVE BIOGEL PI INDICATOR 7.0 (GLOVE) ×1
GLOVE BIOGEL PI INDICATOR 7.5 (GLOVE) ×2
GOWN STRL REUS W/ TWL XL LVL3 (GOWN DISPOSABLE) IMPLANT
GOWN STRL REUS W/TWL XL LVL3 (GOWN DISPOSABLE) ×2
LENS ALC ACRYL/TECN (Ophthalmic Related) ×1 IMPLANT
NDL HYPO 18GX1.5 BLUNT FILL (NEEDLE) IMPLANT
NEEDLE HYPO 18GX1.5 BLUNT FILL (NEEDLE) ×2 IMPLANT
PAD ARMBOARD 7.5X6 YLW CONV (MISCELLANEOUS) ×1 IMPLANT
RING MALYGIN 7.0 (MISCELLANEOUS) IMPLANT
SYR TB 1ML LL NO SAFETY (SYRINGE) ×1 IMPLANT
TAPE SURG TRANSPORE 1 IN (GAUZE/BANDAGES/DRESSINGS) IMPLANT
TAPE SURGICAL TRANSPORE 1 IN (GAUZE/BANDAGES/DRESSINGS) ×2
VISCOELASTIC ADDITIONAL (OPHTHALMIC RELATED) IMPLANT
WATER STERILE IRR 250ML POUR (IV SOLUTION) ×1 IMPLANT

## 2020-05-11 NOTE — Transfer of Care (Signed)
Immediate Anesthesia Transfer of Care Note  Patient: Frank Powell  Procedure(s) Performed: CATARACT EXTRACTION PHACO AND INTRAOCULAR LENS PLACEMENT ([place]) (Left Eye)  Patient Location: Short Stay  Anesthesia Type:MAC  Level of Consciousness: awake, alert  and oriented  Airway & Oxygen Therapy: Patient Spontanous Breathing  Post-op Assessment: Report given to RN and Post -op Vital signs reviewed and stable  Post vital signs: Reviewed and stable  Last Vitals:  Vitals Value Taken Time  BP    Temp    Pulse    Resp    SpO2      Last Pain:  Vitals:   05/11/20 1309  TempSrc: Oral      Patients Stated Pain Goal: 5 (01/65/53 7482)  Complications: No complications documented.

## 2020-05-11 NOTE — Op Note (Signed)
Date of procedure: 05/11/20  Pre-operative diagnosis: Visually significant age-related combined cataract, Left Eye (H25.812)  Post-operative diagnosis: Visually significant age-related combined cataract, Left Eye (H25.812)  Procedure: Removal of cataract via phacoemulsification and insertion of intra-ocular lens [name] and [place]  +21.5D into the capsular bag of the Left Eye  Attending surgeon: Gerda Diss. Jozalyn Baglio, MD, MA  Anesthesia: MAC, Topical Akten  Complications: None  Estimated Blood Loss: <56m (minimal)  Specimens: None  Implants: As above  Indications:  Visually significant age-related cataract, Left Eye  Procedure:  The patient was seen and identified in the pre-operative area. The operative eye was identified and dilated.  The operative eye was marked.  Topical anesthesia was administered to the operative eye.     The patient was then to the operative suite and placed in the supine position.  A timeout was performed confirming the patient, procedure to be performed, and all other relevant information.   The patient's face was prepped and draped in the usual fashion for intra-ocular surgery.  A lid speculum was placed into the operative eye and the surgical microscope moved into place and focused.  An inferotemporal paracentesis was created using a 20 gauge paracentesis blade.  Shugarcaine was injected into the anterior chamber.  Viscoelastic was injected into the anterior chamber.  A temporal clear-corneal main wound incision was created using a 2.439mmicrokeratome.  A continuous curvilinear capsulorrhexis was initiated using an irrigating cystitome and completed using capsulorrhexis forceps.  Hydrodissection and hydrodeliniation were performed.  Viscoelastic was injected into the anterior chamber.  A phacoemulsification handpiece and a chopper as a second instrument were used to remove the nucleus and epinucleus. The irrigation/aspiration handpiece was used to remove  any remaining cortical material.   The capsular bag was reinflated with viscoelastic, checked, and found to be intact.  The intraocular lens was inserted into the capsular bag.  The irrigation/aspiration handpiece was used to remove any remaining viscoelastic.  The clear corneal wound and paracentesis wounds were then hydrated and checked with Weck-Cels to be watertight.  The lid-speculum was removed.  The drape was removed.  The patient's face was cleaned with a wet and dry 4x4.   Maxitrol was instilled in the eye. A clear shield was taped over the eye. The patient was taken to the post-operative care unit in good condition, having tolerated the procedure well.  Post-Op Instructions: [facility] and will receive all other orders and instructions.

## 2020-05-11 NOTE — Anesthesia Procedure Notes (Signed)
Procedure Name: MAC Date/Time: 05/11/2020 1:34 PM Performed by: Orlie Dakin, CRNA Pre-anesthesia Checklist: Patient identified, Emergency Drugs available, Suction available and Patient being monitored Patient Re-evaluated:Patient Re-evaluated prior to induction Oxygen Delivery Method: Nasal cannula Placement Confirmation: positive ETCO2

## 2020-05-11 NOTE — Anesthesia Postprocedure Evaluation (Signed)
Anesthesia Post Note  Patient: SHYHIEM BEENEY  Procedure(s) Performed: CATARACT EXTRACTION PHACO AND INTRAOCULAR LENS PLACEMENT ([place]) (Left Eye)  Patient location during evaluation: Phase II Anesthesia Type: MAC Level of consciousness: awake and alert and oriented Pain management: pain level controlled Vital Signs Assessment: post-procedure vital signs reviewed and stable Respiratory status: spontaneous breathing, respiratory function stable and nonlabored ventilation Cardiovascular status: blood pressure returned to baseline and stable Postop Assessment: no apparent nausea or vomiting Anesthetic complications: no   No complications documented.   Last Vitals:  Vitals:   05/11/20 1309  BP: (!) 96/53  Pulse: 66  Resp: 12  Temp: 36.4 C    Last Pain:  Vitals:   05/11/20 1309  TempSrc: Oral                 Orlie Dakin

## 2020-05-11 NOTE — Interval H&P Note (Signed)
History and Physical Interval Note:  05/11/2020 1:26 PM  Frank Powell  has presented today for surgery, with the diagnosis of Nuclear sclerotic cataract - Left eye.  The various methods of treatment have been discussed with the patient and family. After consideration of risks, benefits and other options for treatment, the patient has consented to  Procedure(s) with comments: CATARACT EXTRACTION PHACO AND INTRAOCULAR LENS PLACEMENT ([facility]) (Left) - left as a surgical intervention.  The patient's history has been reviewed, patient examined, no change in status, stable for surgery.  I have reviewed the patient's chart and labs.  Questions were answered to the patient's satisfaction.     Fabio Pierce

## 2020-05-11 NOTE — Discharge Instructions (Signed)
Please discharge patient when stable, will follow up today with Dr. Corderro Koloski at the [facility] [place] office immediately following discharge.  Leave shield in place until visit.  All paperwork with discharge instructions will be given at the office.  [facility] Address:  [address]  [place], [place] [address]  

## 2020-05-11 NOTE — Anesthesia Preprocedure Evaluation (Addendum)
Anesthesia Evaluation  Patient identified by MRN, date of birth, ID band Patient awake    Reviewed: Allergy & Precautions, NPO status , Patient's Chart, lab work & pertinent test results, reviewed documented beta blocker date and time   History of Anesthesia Complications Negative for: history of anesthetic complications  Airway Mallampati: II  TM Distance: >3 FB Neck ROM: Full    Dental  (+) Edentulous Upper, Edentulous Lower   Pulmonary shortness of breath and with exertion, COPD,  COPD inhaler, Current SmokerPatient did not abstain from smoking.,    Pulmonary exam normal breath sounds clear to auscultation       Cardiovascular Exercise Tolerance: Poor hypertension, Pt. on home beta blockers + angina + CAD, + Past MI, + Cardiac Stents (stopped plavix, noncomplaint with meds), +CHF (cardiomyopathy - EF 35%) and + DOE  Normal cardiovascular exam+ dysrhythmias Atrial Fibrillation + pacemaker  Rhythm:Regular Rate:Normal  PROLONGED QT   Neuro/Psych PSYCHIATRIC DISORDERS Bipolar Disorder CVA, Residual Symptoms    GI/Hepatic Neg liver ROS, GERD  Medicated and Controlled,  Endo/Other  diabetes, Poorly Controlled, Type 2, Insulin Dependent  Renal/GU negative Renal ROS     Musculoskeletal  (+) Arthritis  (Back pain),   Abdominal   Peds  Hematology negative hematology ROS (+)   Anesthesia Other Findings Discussed with Dr. Marisa Hua regarding his cardiac status and uncontrolled blood sugar.  Reproductive/Obstetrics negative OB ROS                             Anesthesia Physical  Anesthesia Plan  ASA: IV  Anesthesia Plan: MAC   Post-op Pain Management:    Induction:   PONV Risk Score and Plan:   Airway Management Planned: Nasal Cannula and Natural Airway  Additional Equipment:   Intra-op Plan:   Post-operative Plan:   Informed Consent: I have reviewed the patients History and Physical,  chart, labs and discussed the procedure including the risks, benefits and alternatives for the proposed anesthesia with the patient or authorized representative who has indicated his/her understanding and acceptance.       Plan Discussed with: CRNA and Surgeon  Anesthesia Plan Comments:         Anesthesia Quick Evaluation

## 2020-05-13 ENCOUNTER — Encounter (HOSPITAL_COMMUNITY): Payer: Self-pay | Admitting: Ophthalmology

## 2020-05-14 ENCOUNTER — Telehealth: Payer: Self-pay | Admitting: Cardiovascular Disease

## 2020-05-14 NOTE — Telephone Encounter (Signed)
New Message    Pt would like to switch from Dr. Royann Shivers to Dr. Johney Frame, because he would like to go to [place] office since that closer to him

## 2020-05-22 ENCOUNTER — Other Ambulatory Visit (HOSPITAL_COMMUNITY): Payer: Medicare Other

## 2020-05-25 NOTE — Telephone Encounter (Signed)
No objections

## 2020-05-28 NOTE — Telephone Encounter (Signed)
    Needs both providers approval

## 2020-05-29 DIAGNOSIS — H5213 Myopia, bilateral: Secondary | ICD-10-CM | POA: Diagnosis not present

## 2020-05-30 NOTE — Telephone Encounter (Signed)
I am happy to see him in [place].

## 2020-06-05 ENCOUNTER — Encounter: Payer: Self-pay | Admitting: Cardiovascular Disease

## 2020-06-12 ENCOUNTER — Other Ambulatory Visit: Payer: Self-pay | Admitting: Cardiovascular Disease

## 2020-06-18 ENCOUNTER — Encounter: Payer: Medicare Other | Admitting: Cardiovascular Disease

## 2020-06-18 DIAGNOSIS — M71322 Other bursal cyst, left elbow: Secondary | ICD-10-CM | POA: Diagnosis not present

## 2020-07-02 ENCOUNTER — Ambulatory Visit: Payer: Medicare Other | Admitting: Orthopaedic Surgery

## 2020-07-02 ENCOUNTER — Other Ambulatory Visit: Payer: Self-pay

## 2020-07-03 ENCOUNTER — Encounter: Payer: Medicare Other | Admitting: Internal Medicine

## 2020-07-13 ENCOUNTER — Other Ambulatory Visit: Payer: Self-pay | Admitting: Cardiovascular Disease

## 2020-07-24 ENCOUNTER — Ambulatory Visit (INDEPENDENT_AMBULATORY_CARE_PROVIDER_SITE_OTHER): Payer: Medicare HMO | Admitting: Internal Medicine

## 2020-07-24 ENCOUNTER — Encounter: Payer: Self-pay | Admitting: Internal Medicine

## 2020-07-24 VITALS — BP 120/70 | HR 70 | Resp 18 | Ht 70.0 in | Wt 207.4 lb

## 2020-07-24 DIAGNOSIS — I255 Ischemic cardiomyopathy: Secondary | ICD-10-CM | POA: Diagnosis not present

## 2020-07-24 DIAGNOSIS — R55 Syncope and collapse: Secondary | ICD-10-CM

## 2020-07-24 DIAGNOSIS — I480 Paroxysmal atrial fibrillation: Secondary | ICD-10-CM

## 2020-07-24 DIAGNOSIS — I25118 Atherosclerotic heart disease of native coronary artery with other forms of angina pectoris: Secondary | ICD-10-CM | POA: Diagnosis not present

## 2020-07-24 NOTE — Progress Notes (Signed)
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 male who presents today for routine electrophysiology followup. He has been noncompliant with office checks and device follow-up.  The patient reports doing very well.  He has chronic SOB.  He smokes and is not active.  Today, he denies symptoms of palpitations, chest pain,  ower extremity edema, dizziness, presyncope, or syncope.  The patient is otherwise without complaint today.   Past Medical History:  Diagnosis Date  . Atrial fib/flutter, transient   . Bipolar 1 disorder (HCC)   . COPD (chronic obstructive pulmonary disease) (HCC)   . Coronary artery disease   . DM2 (diabetes mellitus, type 2) (HCC) 06/02/2013  . GERD (gastroesophageal reflux disease)   . Hypertriglyceridemia 06/02/2013  . Multiple personality (HCC)   . Myocardial infarct (HCC) 03/21/2015   DES LAD x3  . Neurocardiogenic syncope 06/02/2013  . Obesity (BMI 30.0-34.9) 06/02/2013  . Pacemaker 06/02/2013   Past Surgical History:  Procedure Laterality Date  . CARDIAC CATHETERIZATION  09/26/2002   normal coronary arteries and LV  . CARDIAC CATHETERIZATION N/A 03/21/2015   Procedure: Left Heart Cath and Coronary Angiography;  Surgeon: Lennette Bihari, MD; mLAD 100%, dLAD 80%, D2 80%, EF 50-55%  . CARDIAC CATHETERIZATION N/A 03/21/2015   Procedure: Coronary Stent Intervention;  Surgeon: Lennette Bihari, MD; 3.538 mm Xience Alpine DES to the LAD   . CARDIAC CATHETERIZATION N/A 05/25/2016   Procedure: Left Heart Cath and Coronary Angiography;  Surgeon: Corky Crafts, MD;  Location: [facility] INVASIVE CV LAB;  Service: Cardiovascular;  Laterality: N/A;  . CARDIAC CATHETERIZATION N/A 05/25/2016   Procedure: Coronary Stent Intervention;  Surgeon: Corky Crafts, MD;  Location: [facility] INVASIVE CV LAB;  Service: Cardiovascular;  Laterality: N/A;  . CATARACT EXTRACTION W/PHACO Right 04/27/2020   Procedure:  CATARACT EXTRACTION PHACO AND INTRAOCULAR LENS PLACEMENT RIGHT EYE;  Surgeon: Fabio Pierce, MD;  Location: [facility];  Service: Ophthalmology;  Laterality: Right;  CDE: 11.27  . CATARACT EXTRACTION W/PHACO Left 05/11/2020   Procedure: CATARACT EXTRACTION PHACO AND INTRAOCULAR LENS PLACEMENT (IOC);  Surgeon: Fabio Pierce, MD;  Location: [facility];  Service: Ophthalmology;  Laterality: Left;  CDE: 9.64  . NM MYOCAR PERF WALL MOTION  10/06/2011   No ischemia  . PACEMAKER INSERTION Left 02/03/2003   [employer]  . PERMANENT PACEMAKER GENERATOR CHANGE N/A 09/28/2012   Procedure: PERMANENT PACEMAKER GENERATOR CHANGE;  Surgeon: Thurmon Fair, MD; [employer]     ROS- all systems are reviewed and negative except as per HPI above  Current Outpatient Medications  Medication Sig Dispense Refill  . acetaminophen (TYLENOL) 500 MG tablet Take 1,000 mg by mouth every 6 (six) hours as needed for mild pain.    Marland Kitchen albuterol (PROVENTIL HFA;VENTOLIN HFA) 108 (90 BASE) MCG/ACT inhaler Inhale 1-2 puffs into the lungs every 6 (six) hours as needed for wheezing or shortness of breath.    . ALPRAZolam (XANAX) 1 MG tablet Take 1 mg by mouth 2 (two) times daily.     Marland Kitchen aspirin EC 81 MG EC tablet Take 1 tablet (81 mg total) by mouth daily.    . clopidogrel (PLAVIX) 75 MG tablet TAKE 1 TABLET ONCE DAILY WITH FOOD. 30 tablet 0  . COMBIVENT RESPIMAT 20-100 MCG/ACT AERS respimat Inhale 1 puff into the lungs daily.    Marland Kitchen ENTRESTO 24-26 MG Take 1 tablet by mouth 2 (two) times daily.    Marland Kitchen  gabapentin (NEURONTIN) 800 MG tablet Take 800 mg by mouth See admin instructions. Take 800 mg in the morning and afternoon and 1600 mg at bedtime    . gemfibrozil (LOPID) 600 MG tablet Take 600 mg by mouth 2 (two) times daily.    Marland Kitchen glimepiride (AMARYL) 4 MG tablet Take 4 mg by mouth daily.    Marland Kitchen HUMALOG KWIKPEN 100 UNIT/ML KiwkPen Inject 25 Units into the skin daily.     . Insulin Degludec (TRESIBA FLEXTOUCH [place]) Inject 25 Units into the skin in the  morning and at bedtime.     Marland Kitchen LATUDA 80 MG TABS tablet Take 80 mg by mouth daily with breakfast.     . metoprolol tartrate (LOPRESSOR) 50 MG tablet TAKE (1) TABLET TWICE DAILY. (Patient taking differently: Take 50 mg by mouth 2 (two) times daily.) 60 tablet 4  . nitroGLYCERIN (NITROSTAT) 0.4 MG SL tablet PLACE ONE (1) TABLET UNDER TONGUE EVERY 5 MINUTES UP TO (3) DOSES AS NEEDED FOR CHEST PAIN. 25 tablet 3  . potassium chloride (KLOR-CON) 10 MEQ tablet Take 10 mEq by mouth daily.    . QUEtiapine (SEROQUEL XR) 300 MG 24 hr tablet Take 600 mg by mouth at bedtime.    . traZODone (DESYREL) 150 MG tablet Take 300 mg by mouth at bedtime.     No current facility-administered medications for this visit.    Physical Exam: Vitals:   07/24/20 1110  BP: 120/70  Pulse: 70  Resp: 18  SpO2: 99%  Weight: 207 lb 6.4 oz (94.1 kg)  Height: 5\' 10"  (1.778 m)    GEN- The patient is disheveled appearing, alert and oriented x 3 today.   Head- normocephalic, atraumatic Eyes-  Sclera clear, conjunctiva pink Ears- hearing intact Oropharynx- clear Lungs- Clear to ausculation bilaterally, normal work of breathing Chest- pacemaker pocket is well healed Heart- Regular rate and rhythm, no murmurs, rubs or gallops, PMI not laterally displaced GI- soft, NT, ND, + BS Extremities- no clubbing, cyanosis, or edema  Pacemaker interrogation- reviewed in detail today,  See PACEART report  ekg tracing ordered today is personally reviewed and shows sinus, no ischemic changes  Assessment and Plan:  1. Symptomatic neurocardiogenic/ cardioinhinitory syncope with sinus bradycardia Normal pacemaker function See Pace Art report No changes today he is not device dependant today  2. CAD No ischemic symptoms Stable No change required today  3. Tobacco Cessation advised  4. Bipolar disorder He has had prolonged qt in the past with medicines Will need to be careful with medicines going forward  5.  afib Previously observed (only 12 minute episode) by Dr Given low burden he has not been anticoagulated  Risks, benefits and potential toxicities for medications prescribed and/or refilled reviewed with patient today.   He cannot perform remote transmissions and does not have anyone that can help him Return in a year He will need to establish with general cardiology in the interim in [place].  [place] MD, [facility] 07/24/2020 11:39 AM

## 2020-07-24 NOTE — Patient Instructions (Addendum)
Medication Instructions:   Your physician recommends that you continue on your current medications as directed. Please refer to the Current Medication list given to you today.  Labwork:  None  Testing/Procedures:  None  Follow-Up:  Your physician recommends that you schedule a follow-up appointment in: 1 year with Dr. Johney Frame  Your physician recommends that you schedule a follow-up appointment with your new cardiologist at the next available appointment time.  Any Other Special Instructions Will Be Listed Below (If Applicable).  If you need a refill on your cardiac medications before your next appointment, please call your pharmacy.

## 2020-07-27 DIAGNOSIS — F39 Unspecified mood [affective] disorder: Secondary | ICD-10-CM | POA: Diagnosis not present

## 2020-07-27 DIAGNOSIS — R69 Illness, unspecified: Secondary | ICD-10-CM | POA: Diagnosis not present

## 2020-07-30 DIAGNOSIS — R55 Syncope and collapse: Secondary | ICD-10-CM | POA: Diagnosis not present

## 2020-07-30 DIAGNOSIS — Z6832 Body mass index (BMI) 32.0-32.9, adult: Secondary | ICD-10-CM | POA: Diagnosis not present

## 2020-07-30 DIAGNOSIS — R404 Transient alteration of awareness: Secondary | ICD-10-CM | POA: Diagnosis not present

## 2020-07-31 DIAGNOSIS — R404 Transient alteration of awareness: Secondary | ICD-10-CM | POA: Diagnosis not present

## 2020-07-31 DIAGNOSIS — Z743 Need for continuous supervision: Secondary | ICD-10-CM | POA: Diagnosis not present

## 2020-08-13 LAB — CUP PACEART INCLINIC DEVICE CHECK
Battery Impedance: 620 Ohm
Battery Remaining Longevity: 101 mo
Battery Voltage: 2.78 V
Brady Statistic AP VP Percent: 0 %
Brady Statistic AP VS Percent: 0 %
Brady Statistic AS VP Percent: 0 %
Brady Statistic AS VS Percent: 100 %
Date Time Interrogation Session: 20220107110000
Implantable Lead Implant Date: 20040719
Implantable Lead Implant Date: 20040719
Implantable Lead Location: 753859
Implantable Lead Location: 753860
Implantable Lead Model: 5076
Implantable Lead Model: 5076
Implantable Pulse Generator Implant Date: 20140314
Lead Channel Impedance Value: 506 Ohm
Lead Channel Impedance Value: 531 Ohm
Lead Channel Pacing Threshold Amplitude: 0.75 V
Lead Channel Pacing Threshold Amplitude: 1.25 V
Lead Channel Pacing Threshold Pulse Width: 0.4 ms
Lead Channel Pacing Threshold Pulse Width: 0.4 ms
Lead Channel Sensing Intrinsic Amplitude: 4 mV
Lead Channel Sensing Intrinsic Amplitude: 8 mV
Lead Channel Setting Pacing Amplitude: 1.5 V
Lead Channel Setting Pacing Amplitude: 2.25 V
Lead Channel Setting Pacing Pulse Width: 0.4 ms
Lead Channel Setting Sensing Sensitivity: 4 mV

## 2020-08-18 DIAGNOSIS — 419620001 Death: Secondary | SNOMED CT | POA: Diagnosis not present

## 2020-08-18 DEATH — deceased

## 2021-07-23 ENCOUNTER — Encounter: Payer: Medicare HMO | Admitting: Internal Medicine
